# Patient Record
Sex: Male | Born: 1950 | Race: White | Hispanic: No | Smoking: Never smoker
Health system: Southern US, Community
[De-identification: ages and names within clinical notes are randomized; demographics above are authoritative.]

## PROBLEM LIST (undated history)

## (undated) DIAGNOSIS — I10 Essential (primary) hypertension: Secondary | ICD-10-CM

## (undated) DIAGNOSIS — F32A Depression, unspecified: Secondary | ICD-10-CM

## (undated) DIAGNOSIS — F329 Major depressive disorder, single episode, unspecified: Secondary | ICD-10-CM

## (undated) DIAGNOSIS — I2510 Atherosclerotic heart disease of native coronary artery without angina pectoris: Secondary | ICD-10-CM

## (undated) DIAGNOSIS — F419 Anxiety disorder, unspecified: Secondary | ICD-10-CM

## (undated) DIAGNOSIS — I341 Nonrheumatic mitral (valve) prolapse: Secondary | ICD-10-CM

## (undated) HISTORY — DX: Major depressive disorder, single episode, unspecified: F32.9

## (undated) HISTORY — DX: Depression, unspecified: F32.A

## (undated) HISTORY — DX: Anxiety disorder, unspecified: F41.9

---

## 2012-10-21 ENCOUNTER — Ambulatory Visit (INDEPENDENT_AMBULATORY_CARE_PROVIDER_SITE_OTHER): Payer: 59 | Admitting: Surgery

## 2012-10-21 ENCOUNTER — Encounter (INDEPENDENT_AMBULATORY_CARE_PROVIDER_SITE_OTHER): Payer: Self-pay | Admitting: Surgery

## 2012-10-21 ENCOUNTER — Encounter (HOSPITAL_BASED_OUTPATIENT_CLINIC_OR_DEPARTMENT_OTHER): Payer: Self-pay | Admitting: *Deleted

## 2012-10-21 VITALS — BP 126/88 | HR 83 | Temp 97.6°F | Ht 66.0 in | Wt 169.4 lb

## 2012-10-21 DIAGNOSIS — K4090 Unilateral inguinal hernia, without obstruction or gangrene, not specified as recurrent: Secondary | ICD-10-CM

## 2012-10-21 NOTE — Patient Instructions (Signed)
[facility]  HERNIA REPAIR POST OP INSTRUCTIONS  Always review your discharge instruction sheet given to you by the facility where your surgery was performed.  1. A  prescription for pain medication may be given to you upon discharge.  Take your pain medication as prescribed.  If narcotic pain medicine is not needed, then you may take acetaminophen (Tylenol) or ibuprofen (Advil) as needed.  2. Take your usually prescribed medications unless otherwise directed.  3. If you need a refill on your pain medication, please contact your pharmacy.  They will contact our office to request authorization. Prescriptions will not be filled after 5 pm daily or on weekends.  4. You should follow a light diet the first 24 hours after arrival home, such as soup and crackers or toast.  Be sure to include plenty of fluids daily.  Resume your normal diet the day after surgery.  5. Most patients will experience some swelling and bruising around the surgical site.  Ice packs and reclining will help.  Swelling and bruising can take several days to resolve.   6. It is common to experience some constipation if taking pain medication after surgery.  Increasing fluid intake and taking a stool softener (such as Colace) will usually help or prevent this problem from occurring.  A mild laxative (Milk of Magnesia or Miralax) should be taken according to package directions if there are no bowel movements after 48 hours.  7. Unless discharge instructions indicate otherwise, you may remove your bandages 24-48 hours after surgery, and you may shower at that time.  You may have steri-strips (small skin tapes) in place directly over the incision.  These strips should be left on the skin for 7-10 days.  If your surgeon used skin glue on the incision, you may shower in 24 hours.  The glue will flake off over the next 2-3 weeks.  Any sutures or staples will be removed at the office during your follow-up  visit.  8. ACTIVITIES:  You may resume regular (light) daily activities beginning the next day-such as daily self-care, walking, climbing stairs-gradually increasing activities as tolerated.  You may have sexual intercourse when it is comfortable.  Refrain from any heavy lifting or straining until approved by your doctor.  You may drive when you are no longer taking prescription pain medication, you can comfortably wear a seatbelt, and you can safely maneuver your car and apply brakes.  9. You should see your doctor in the office for a follow-up appointment approximately 2-3 weeks after your surgery.  Make sure that you call for this appointment within a day or two after you arrive home to insure a convenient appointment time. 10.   WHEN TO CALL YOUR DOCTOR: 1. Fever greater than 101.0 2. Inability to urinate 3. Persistent nausea and/or vomiting 4. Extreme swelling or bruising 5. Continued bleeding from incision 6. Increased pain, redness, or drainage from the incision  The clinic staff is available to answer your questions during regular business hours.  Please don't hesitate to call and ask to speak to one of the nurses for clinical concerns.  If you have a medical emergency, go to the nearest emergency room or call 911.  A surgeon from [facility] is always on call for the hospital.   [facility] [address], [address], [place],   [address]  [phone] ? [phone] ? FAX [phone]  [email or web address]   

## 2012-10-21 NOTE — Progress Notes (Signed)
General Surgery [facility]  Chief Complaint  Patient presents with  . New Evaluation    eval [facility] - referral from Dr. Sandi Mariscal    HISTORY: Patient is a 62 year old white male who presents with newly diagnosed right inguinal hernia. Patient had initially done some lifting in the fall while moving heavy boxes. He experienced some discomfort in the right groin. This has become progressively more noticeable. He is noted a bulge in the right groin. He has intermittent discomfort related to physical activity. Patient resented to his primary care physician and was diagnosed with right inguinal hernia. He now presents for surgical assessment and repair of right inguinal hernia.  Patient has no prior history of abdominal surgery. He has undergone no prior hernia repairs.  Past Medical History  Diagnosis Date  . Anxiety   . Depression      Current Outpatient Prescriptions  Medication Sig Dispense Refill  . ALPRAZolam (XANAX) 0.5 MG tablet       . docusate sodium (COLACE) 100 MG capsule Take 100 mg by mouth 2 (two) times daily.      . phenelzine (NARDIL) 15 MG tablet        No current facility-administered medications for this visit.     Allergies  Allergen Reactions  . Aspirin     eyes     Family History  Problem Relation Age of Onset  . Diabetes Mother   . Congestive Heart Failure Mother   . Stroke Mother   . Aneurysm Father      History   Social History  . Marital Status: Unknown    Spouse Name: N/A    Number of Children: N/A  . Years of Education: N/A   Social History Main Topics  . Smoking status: Never Smoker   . Smokeless tobacco: None  . Alcohol Use: No  . Drug Use: No  . Sexually Active: None   Other Topics Concern  . None   Social History Narrative  . None     REVIEW OF SYSTEMS - PERTINENT POSITIVES ONLY: Intermittent discomfort. No signs or symptoms of intestinal obstruction.  EXAM: Filed Vitals:   10/21/12 1155   BP: 126/88  Pulse: 83  Temp: 97.6 F (36.4 C)    HEENT: normocephalic; pupils equal and reactive; sclerae clear; dentition good; mucous membranes moist NECK:  symmetric on extension; no palpable anterior or posterior cervical lymphadenopathy; no supraclavicular masses; no tenderness CHEST: clear to auscultation bilaterally without rales, rhonchi, or wheezes CARDIAC: regular rate and rhythm without significant murmur; peripheral pulses are full ABDOMEN: soft without distension; bowel sounds present; no mass; no hepatosplenomegaly GU:  Normal male without mass or lesion. Palpation in the left inguinal canal shows no sign of hernia with cough and Valsalva. Palpation in the right inguinal canal shows an obvious bulge. This is reducible with some discomfort. Examination causes moderate discomfort. EXT:  non-tender without edema; no deformity NEURO: no gross focal deficits; no sign of tremor   LABORATORY RESULTS: See [facility] (CHL-Epic) for most recent results   RADIOLOGY RESULTS: See [facility] (CHL-Epic) for most recent results   IMPRESSION: Right inguinal hernia, reducible, symptomatic  PLAN: I discussed the above findings with the patient and his friend in the office today. I provided him with written literature to review. I have recommended right inguinal hernia repair with mesh. We discussed the risk and benefits of the procedure including the risk of recurrence. We discussed restrictions on his activities after the  procedure. He understands and wishes to proceed. We will make arrangements for outpatient surgery in the near future.  The risks and benefits of the procedure have been discussed at length with the patient.  The patient understands the proposed procedure, potential alternative treatments, and the course of recovery to be expected.  All of the patient's questions have been answered at this time.  The patient wishes to proceed with surgery.  Velora Heckler, MD,  FACS General & Endocrine Surgery [facility]   Visit Diagnoses: 1. Inguinal hernia unilateral, non-recurrent, right     Primary Care Physician: No primary provider on file.

## 2012-10-21 NOTE — Progress Notes (Signed)
Called [facility] for pt med records.

## 2012-10-22 ENCOUNTER — Encounter (HOSPITAL_BASED_OUTPATIENT_CLINIC_OR_DEPARTMENT_OTHER)
Admission: RE | Disposition: A | Discharge status: Home / Self Care | Payer: Self-pay | Source: Ambulatory Visit | Attending: Surgery

## 2012-10-22 ENCOUNTER — Ambulatory Visit (HOSPITAL_BASED_OUTPATIENT_CLINIC_OR_DEPARTMENT_OTHER): Payer: 59 | Admitting: Anesthesiology

## 2012-10-22 ENCOUNTER — Ambulatory Visit (HOSPITAL_BASED_OUTPATIENT_CLINIC_OR_DEPARTMENT_OTHER)
Admission: RE | Admit: 2012-10-22 | Discharge: 2012-10-22 | Disposition: A | Discharge status: Home / Self Care | Payer: 59 | Source: Ambulatory Visit | Attending: Surgery | Admitting: Surgery

## 2012-10-22 ENCOUNTER — Encounter (HOSPITAL_BASED_OUTPATIENT_CLINIC_OR_DEPARTMENT_OTHER): Payer: Self-pay | Admitting: Anesthesiology

## 2012-10-22 DIAGNOSIS — F411 Generalized anxiety disorder: Secondary | ICD-10-CM | POA: Insufficient documentation

## 2012-10-22 DIAGNOSIS — Z886 Allergy status to analgesic agent status: Secondary | ICD-10-CM | POA: Insufficient documentation

## 2012-10-22 DIAGNOSIS — K4090 Unilateral inguinal hernia, without obstruction or gangrene, not specified as recurrent: Secondary | ICD-10-CM

## 2012-10-22 DIAGNOSIS — F3289 Other specified depressive episodes: Secondary | ICD-10-CM | POA: Insufficient documentation

## 2012-10-22 DIAGNOSIS — F329 Major depressive disorder, single episode, unspecified: Secondary | ICD-10-CM | POA: Insufficient documentation

## 2012-10-22 DIAGNOSIS — Z79899 Other long term (current) drug therapy: Secondary | ICD-10-CM | POA: Insufficient documentation

## 2012-10-22 HISTORY — DX: Nonrheumatic mitral (valve) prolapse: I34.1

## 2012-10-22 HISTORY — DX: Atherosclerotic heart disease of native coronary artery without angina pectoris: I25.10

## 2012-10-22 HISTORY — PX: INGUINAL HERNIA REPAIR: SHX194

## 2012-10-22 HISTORY — PX: HERNIA REPAIR: SHX51

## 2012-10-22 HISTORY — PX: INSERTION OF MESH: SHX5868

## 2012-10-22 SURGERY — REPAIR, HERNIA, INGUINAL, ADULT
Anesthesia: General | Site: Abdomen | Laterality: Right | Wound class: Clean

## 2012-10-22 MED ORDER — ONDANSETRON HCL 4 MG/2ML IJ SOLN: 4.0000 mg | Freq: Once | INTRAMUSCULAR | Status: DC | PRN

## 2012-10-22 MED ORDER — FENTANYL CITRATE 0.05 MG/ML IJ SOLN
INTRAMUSCULAR | Status: DC | PRN
  Administered 2012-10-22 (×3): 50 ug via INTRAVENOUS

## 2012-10-22 MED ORDER — ACETAMINOPHEN 10 MG/ML IV SOLN: 1000.0000 mg | Freq: Once | INTRAVENOUS | Status: DC | PRN

## 2012-10-22 MED ORDER — MIDAZOLAM HCL 5 MG/5ML IJ SOLN
INTRAMUSCULAR | Status: DC | PRN
  Administered 2012-10-22 (×2): 1 mg via INTRAVENOUS

## 2012-10-22 MED ORDER — FENTANYL CITRATE 0.05 MG/ML IJ SOLN
25.0000 ug | INTRAMUSCULAR | Status: DC | PRN
Start: 2012-10-22 — End: 2012-10-22
  Administered 2012-10-22: 25 ug via INTRAVENOUS
  Administered 2012-10-22: 50 ug via INTRAVENOUS

## 2012-10-22 MED ORDER — ONDANSETRON HCL 4 MG/2ML IJ SOLN
INTRAMUSCULAR | Status: DC | PRN
  Administered 2012-10-22: 4 mg via INTRAVENOUS

## 2012-10-22 MED ORDER — CEFAZOLIN SODIUM-DEXTROSE 2-3 GM-% IV SOLR
2.0000 g | INTRAVENOUS | Status: AC
  Administered 2012-10-22: 2 g via INTRAVENOUS

## 2012-10-22 MED ORDER — BUPIVACAINE HCL (PF) 0.5 % IJ SOLN
INTRAMUSCULAR | Status: DC | PRN
  Administered 2012-10-22: 20 mL

## 2012-10-22 MED ORDER — LACTATED RINGERS IV SOLN
INTRAVENOUS | Status: DC
  Administered 2012-10-22: 20 mL/h via INTRAVENOUS
  Administered 2012-10-22 (×2): via INTRAVENOUS

## 2012-10-22 MED ORDER — HYDROCODONE-ACETAMINOPHEN 5-325 MG PO TABS
1.0000 | ORAL_TABLET | Freq: Four times a day (QID) | ORAL | Status: AC | PRN
  Administered 2012-10-22: 1 via ORAL

## 2012-10-22 MED ORDER — PHENYLEPHRINE HCL 10 MG/ML IJ SOLN
INTRAMUSCULAR | Status: DC | PRN
  Administered 2012-10-22 (×4): 40 ug via INTRAVENOUS

## 2012-10-22 MED ORDER — LIDOCAINE HCL (CARDIAC) 20 MG/ML IV SOLN
INTRAVENOUS | Status: DC | PRN
  Administered 2012-10-22: 40 mg via INTRAVENOUS

## 2012-10-22 MED ORDER — PROPOFOL 10 MG/ML IV BOLUS
INTRAVENOUS | Status: DC | PRN
  Administered 2012-10-22: 20 mg via INTRAVENOUS
  Administered 2012-10-22: 30 mg via INTRAVENOUS
  Administered 2012-10-22: 180 mg via INTRAVENOUS

## 2012-10-22 SURGICAL SUPPLY — 42 items
BENZOIN TINCTURE PRP APPL 2/3 (GAUZE/BANDAGES/DRESSINGS) ×3 IMPLANT
BLADE SURG 15 STRL LF DISP TIS (BLADE) ×2 IMPLANT
BLADE SURG 15 STRL SS (BLADE) ×1
BLADE SURG ROTATE 9660 (MISCELLANEOUS) ×3 IMPLANT
CANISTER SUCTION 1200CC (MISCELLANEOUS) IMPLANT
CHLORAPREP W/TINT 26ML (MISCELLANEOUS) ×3 IMPLANT
CLEANER CAUTERY TIP 5X5 PAD (MISCELLANEOUS) ×2 IMPLANT
CLOTH BEACON ORANGE TIMEOUT ST (SAFETY) ×3 IMPLANT
COVER MAYO STAND STRL (DRAPES) ×3 IMPLANT
COVER TABLE BACK 60X90 (DRAPES) ×3 IMPLANT
DECANTER SPIKE VIAL GLASS SM (MISCELLANEOUS) IMPLANT
DRAIN PENROSE 1/2X12 LTX STRL (WOUND CARE) ×3 IMPLANT
DRAPE PED LAPAROTOMY (DRAPES) ×3 IMPLANT
DRAPE UTILITY XL STRL (DRAPES) ×3 IMPLANT
ELECT REM PT RETURN 9FT ADLT (ELECTROSURGICAL) ×3
ELECTRODE REM PT RTRN 9FT ADLT (ELECTROSURGICAL) ×2 IMPLANT
GAUZE SPONGE 4X4 12PLY STRL LF (GAUZE/BANDAGES/DRESSINGS) IMPLANT
GLOVE BIO SURGEON STRL SZ7.5 (GLOVE) ×3 IMPLANT
GLOVE BIOGEL PI IND STRL 8 (GLOVE) ×2 IMPLANT
GLOVE BIOGEL PI INDICATOR 8 (GLOVE) ×1
GLOVE SURG ORTHO 8.0 STRL STRW (GLOVE) ×6 IMPLANT
GOWN PREVENTION PLUS XLARGE (GOWN DISPOSABLE) ×3 IMPLANT
GOWN PREVENTION PLUS XXLARGE (GOWN DISPOSABLE) ×6 IMPLANT
MESH ULTRAPRO 3X6 7.6X15CM (Mesh General) ×3 IMPLANT
NEEDLE HYPO 25X1 1.5 SAFETY (NEEDLE) ×3 IMPLANT
NS IRRIG 1000ML POUR BTL (IV SOLUTION) ×3 IMPLANT
PACK BASIN DAY SURGERY FS (CUSTOM PROCEDURE TRAY) ×3 IMPLANT
PAD CLEANER CAUTERY TIP 5X5 (MISCELLANEOUS) ×1
PENCIL BUTTON HOLSTER BLD 10FT (ELECTRODE) ×3 IMPLANT
SLEEVE SCD COMPRESS KNEE MED (MISCELLANEOUS) ×3 IMPLANT
STRIP CLOSURE SKIN 1/2X4 (GAUZE/BANDAGES/DRESSINGS) ×3 IMPLANT
SUT MNCRL AB 4-0 PS2 18 (SUTURE) ×3 IMPLANT
SUT NOVA NAB GS-22 2 0 T19 (SUTURE) ×6 IMPLANT
SUT SILK 2 0 SH (SUTURE) ×3 IMPLANT
SUT SILK 2 0 TIES 17X18 (SUTURE)
SUT SILK 2-0 18XBRD TIE BLK (SUTURE) IMPLANT
SUT VICRYL 3-0 CR8 SH (SUTURE) ×3 IMPLANT
SYR CONTROL 10ML LL (SYRINGE) ×3 IMPLANT
TOWEL OR 17X24 6PK STRL BLUE (TOWEL DISPOSABLE) ×6 IMPLANT
TOWEL OR NON WOVEN STRL DISP B (DISPOSABLE) ×3 IMPLANT
TUBE CONNECTING 20X1/4 (TUBING) IMPLANT
YANKAUER SUCT BULB TIP NO VENT (SUCTIONS) IMPLANT

## 2012-10-22 NOTE — Interval H&P Note (Signed)
History and Physical Interval Note:  10/22/2012 2:57 PM  Shane Hendricks  has presented today for surgery, with the diagnosis of right ingunial hernia.  The various methods of treatment have been discussed with the patient and family. After consideration of risks, benefits and other options for treatment, the patient has consented to    Procedure(s): HERNIA REPAIR INGUINAL ADULT (Right) INSERTION OF MESH (N/A) as a surgical intervention .    The patient's history has been reviewed, patient examined, no change in status, stable for surgery.  I have reviewed the patient's chart and labs.  Questions were answered to the patient's satisfaction.    Velora Heckler, MD, [facility] Office: [phone]    Shane Hendricks Judie Petit

## 2012-10-22 NOTE — Anesthesia Postprocedure Evaluation (Signed)
  Anesthesia Post-op Note  Patient: Shane Hendricks  Procedure(s) Performed: Procedure(s): HERNIA REPAIR INGUINAL ADULT (Right) INSERTION OF MESH (N/A)  Patient Location: PACU  Anesthesia Type:General  Level of Consciousness: awake, alert  and oriented  Airway and Oxygen Therapy: Patient Spontanous Breathing and Patient connected to nasal cannula oxygen  Post-op Pain: mild  Post-op Assessment: Post-op Vital signs reviewed, Patient's Cardiovascular Status Stable, Respiratory Function Stable, Patent Airway and Pain level controlled  Post-op Vital Signs: stable  Complications: No apparent anesthesia complications

## 2012-10-22 NOTE — Op Note (Signed)
Inguinal Hernia, Open, Procedure Note  Pre-operative Diagnosis:  Right inguinal hernia  Post-operative Diagnosis: right inguinal hernia, right spermatic cord mass  Surgeon:  Velora Heckler, MD, FACS  Anesthesia:  General  Indications: The patient presented with a right, reducible hernia.    Procedure Details  The patient was seen again in the Holding Room. The risks, benefits, complications, treatment options, and expected outcomes were discussed with the patient.  There was concurrence with the proposed plan, and informed consent was obtained. The site of surgery was properly noted/marked. The patient was taken to the Operating Room, identified by name, and the procedure verified as hernia repair. A Time Out was held and the above information confirmed.  The patient was placed in the supine position and underwent induction of anesthesia.  The lower abdomen and groin was prepped and draped in the usual strict aseptic fashion.  After ascertaining that an adequate level of anesthesia had been obtained, and incision is made in the groin with a #10 blade.  Dissection is carried through the subcutaneous tissues and hemostasis obtained with the electrocautery.  A Gelpi retractor is placed for exposure.  The external oblique fascia is incised in line with it's fibers and extended through the external inguinal ring.  The cord structures are dissected out of the inguinal canal and encircled with a Penrose drain.  The floor of the inguinal canal is dissected out.  The cord is explored explored and there is no evidence of indirect hernia.  The floor of the canal demonstrates a moderate sized direct hernia which is easily reduced.  It is held in reduction with interrupted 2-0 silk sutures.  The floor of the inguinal canal is reconstructed with a sheet of mesh cut to the appropriate dimensions.  It is secured to the pubic tubercle with a 2-0 Novafil suture and along the inguinal ligament with a running 2-0  Novafil suture.  Mesh is split to accommodate the cord structures.  The superior edge of the mesh is secured to the transversalis and internal oblique muscles with interrupted 2-0 Novafil sutures.  The tails of the mesh are overlapped lateral to the cord structures and secured to the inguinal ligament with interrupted 2-0 Novafil sutures to recreate the internal inguinal ring.  Cord structures are returned to the inguinal canal.  Local anesthetic is infiltrated throughout the field.  External oblique fascia is closed with interrupted 3-0 Vicryl sutures.  Subcutaneous tissues are closed with interrupted 3-0 Vicryl sutures.  Skin is anesthetized with local anesthetic, and the skin edges re-approximated with a running 4-0 Monocryl suture.  Wound is washed and dried and benzoin and steristrips are applied.  A gauze dressing is then applied.  Palpation reveals the right testicle to be in the right hemiscrotum.  However, there is a palpable mass measuring about 3 cm in size in the superior part of the scrotum.  This structure is firm but mobile.  It will require urologic evaluation.  Instrument, sponge, and needle counts were correct prior to closure and at the conclusion of the case.  Velora Heckler, MD, FACS General & Endocrine Surgery [facility]   Findings: Hernia as above  Estimated Blood Loss: Minimal         Specimens: None  Complications: None; patient tolerated the procedure well.         Disposition: PACU - hemodynamically stable.         Condition: stable  Velora Heckler, MD, [facility] Office: [phone]

## 2012-10-22 NOTE — Anesthesia Preprocedure Evaluation (Signed)
Anesthesia Evaluation  Patient identified by MRN, date of birth, ID band Patient awake    Reviewed: Allergy & Precautions, H&P , NPO status , Patient's Chart, lab work & pertinent test results  Airway Mallampati: II      Dental  (+) Teeth Intact and Dental Advisory Given   Pulmonary  breath sounds clear to auscultation        Cardiovascular Rhythm:Regular Rate:Normal     Neuro/Psych    GI/Hepatic   Endo/Other    Renal/GU      Musculoskeletal   Abdominal   Peds  Hematology   Anesthesia Other Findings   Reproductive/Obstetrics                           Anesthesia Physical Anesthesia Plan  ASA: III  Anesthesia Plan: General   Post-op Pain Management:    Induction: Intravenous  Airway Management Planned: LMA  Additional Equipment:   Intra-op Plan:   Post-operative Plan:   Informed Consent: I have reviewed the patients History and Physical, chart, labs and discussed the procedure including the risks, benefits and alternatives for the proposed anesthesia with the patient or authorized representative who has indicated his/her understanding and acceptance.   Dental advisory given  Plan Discussed with:   Anesthesia Plan Comments: (R. iguinal hernia H/O depression on Nardil (MAOI)   Plan GA with LMA will avoid demerol and ephedrine)        Anesthesia Quick Evaluation

## 2012-10-22 NOTE — Transfer of Care (Signed)
Immediate Anesthesia Transfer of Care Note  Patient: Shane Hendricks  Procedure(s) Performed: Procedure(s): HERNIA REPAIR INGUINAL ADULT (Right) INSERTION OF MESH (N/A)  Patient Location: PACU  Anesthesia Type:General  Level of Consciousness: sedated  Airway & Oxygen Therapy: Patient Spontanous Breathing and Patient connected to face mask oxygen  Post-op Assessment: Report given to PACU RN and Post -op Vital signs reviewed and stable  Post vital signs: Reviewed and stable  Complications: No apparent anesthesia complications

## 2012-10-22 NOTE — H&P (View-Only) (Signed)
General Surgery - [facility]  Chief Complaint  Patient presents with  . New Evaluation    eval [facility] - referral from Dr. David Rinehart    HISTORY: Patient is a 62-year-old white male who presents with newly diagnosed right inguinal hernia. Patient had initially done some lifting in the fall while moving heavy boxes. He experienced some discomfort in the right groin. This has become progressively more noticeable. He is noted a bulge in the right groin. He has intermittent discomfort related to physical activity. Patient resented to his primary care physician and was diagnosed with right inguinal hernia. He now presents for surgical assessment and repair of right inguinal hernia.  Patient has no prior history of abdominal surgery. He has undergone no prior hernia repairs.  Past Medical History  Diagnosis Date  . Anxiety   . Depression      Current Outpatient Prescriptions  Medication Sig Dispense Refill  . ALPRAZolam (XANAX) 0.5 MG tablet       . docusate sodium (COLACE) 100 MG capsule Take 100 mg by mouth 2 (two) times daily.      . phenelzine (NARDIL) 15 MG tablet        No current facility-administered medications for this visit.     Allergies  Allergen Reactions  . Aspirin     eyes     Family History  Problem Relation Age of Onset  . Diabetes Mother   . Congestive Heart Failure Mother   . Stroke Mother   . Aneurysm Father      History   Social History  . Marital Status: Unknown    Spouse Name: N/A    Number of Children: N/A  . Years of Education: N/A   Social History Main Topics  . Smoking status: Never Smoker   . Smokeless tobacco: None  . Alcohol Use: No  . Drug Use: No  . Sexually Active: None   Other Topics Concern  . None   Social History Narrative  . None     REVIEW OF SYSTEMS - PERTINENT POSITIVES ONLY: Intermittent discomfort. No signs or symptoms of intestinal obstruction.  EXAM: Filed Vitals:   10/21/12 1155   BP: 126/88  Pulse: 83  Temp: 97.6 F (36.4 C)    HEENT: normocephalic; pupils equal and reactive; sclerae clear; dentition good; mucous membranes moist NECK:  symmetric on extension; no palpable anterior or posterior cervical lymphadenopathy; no supraclavicular masses; no tenderness CHEST: clear to auscultation bilaterally without rales, rhonchi, or wheezes CARDIAC: regular rate and rhythm without significant murmur; peripheral pulses are full ABDOMEN: soft without distension; bowel sounds present; no mass; no hepatosplenomegaly GU:  Normal male without mass or lesion. Palpation in the left inguinal canal shows no sign of hernia with cough and Valsalva. Palpation in the right inguinal canal shows an obvious bulge. This is reducible with some discomfort. Examination causes moderate discomfort. EXT:  non-tender without edema; no deformity NEURO: no gross focal deficits; no sign of tremor   LABORATORY RESULTS: See [facility] (CHL-Epic) for most recent results   RADIOLOGY RESULTS: See [facility] (CHL-Epic) for most recent results   IMPRESSION: Right inguinal hernia, reducible, symptomatic  PLAN: I discussed the above findings with the patient and his friend in the office today. I provided him with written literature to review. I have recommended right inguinal hernia repair with mesh. We discussed the risk and benefits of the procedure including the risk of recurrence. We discussed restrictions on his activities after the   procedure. He understands and wishes to proceed. We will make arrangements for outpatient surgery in the near future.  The risks and benefits of the procedure have been discussed at length with the patient.  The patient understands the proposed procedure, potential alternative treatments, and the course of recovery to be expected.  All of the patient's questions have been answered at this time.  The patient wishes to proceed with surgery.  Carrie Schoonmaker M. Hazelee Harbold, MD,  FACS General & Endocrine Surgery [facility]   Visit Diagnoses: 1. Inguinal hernia unilateral, non-recurrent, right     Primary Care Physician: No primary provider on file.   

## 2012-10-22 NOTE — Anesthesia Procedure Notes (Signed)
Procedure Name: LMA Insertion Date/Time: 10/22/2012 3:06 PM Performed by: Burna Cash Pre-anesthesia Checklist: Patient identified, Emergency Drugs available, Suction available and Patient being monitored Patient Re-evaluated:Patient Re-evaluated prior to inductionOxygen Delivery Method: Circle System Utilized Preoxygenation: Pre-oxygenation with 100% oxygen Intubation Type: IV induction Ventilation: Mask ventilation without difficulty LMA: LMA inserted LMA Size: 5.0 Number of attempts: 1 Airway Equipment and Method: bite block Placement Confirmation: positive ETCO2 Tube secured with: Tape Dental Injury: Teeth and Oropharynx as per pre-operative assessment

## 2012-10-23 ENCOUNTER — Telehealth (INDEPENDENT_AMBULATORY_CARE_PROVIDER_SITE_OTHER): Payer: Self-pay | Admitting: *Deleted

## 2012-10-23 ENCOUNTER — Encounter (HOSPITAL_BASED_OUTPATIENT_CLINIC_OR_DEPARTMENT_OTHER): Payer: Self-pay | Admitting: Surgery

## 2012-10-23 ENCOUNTER — Telehealth (INDEPENDENT_AMBULATORY_CARE_PROVIDER_SITE_OTHER): Payer: Self-pay

## 2012-10-23 NOTE — Telephone Encounter (Signed)
Pts caregiver Alinda Money called state he himself is having medical issues, anxiety and is having difficulty dealing with pts needs. I spoke with pt and he states he is also having severe anxiety issues and is treated by Dr Sheilah Mins local to his home. Pt advised he should call Dr Sheilah Mins and review his anxiety concerns or go to local ER if he feels his anxiety is overwhelming. Pt states his wound is fine. Pt also advised to limit narcotic meds if he is noticing this increases his anxiety. Pt states he understands and will call Dr Sheilah Mins now.

## 2012-10-23 NOTE — Telephone Encounter (Signed)
Telephone call from [employer].  Pt c/o dizziness with pain meds.  Patient states he has not been eating and has been taking hydrocodone regularly.  Patient became dizzy upon standing.  Advised patient to eat and take 1/2 of the hydrocodone pill if he desires.  Patient agrees with plan.  Will call back if dizziness continues after eating.

## 2012-10-26 ENCOUNTER — Telehealth (INDEPENDENT_AMBULATORY_CARE_PROVIDER_SITE_OTHER): Payer: Self-pay

## 2012-10-26 NOTE — Telephone Encounter (Signed)
Pt called stating Dr Gerrit Friends mentioned a scrotal mass that will need urology eval in future. Pt wants to know when this referral be made. I advised pt I will send request to Dr Gerrit Friends to see if ref should be made now or after po appt and what urologist in [place] he recommends. Pt advised he or I will call pt back with recommendation.

## 2012-10-28 ENCOUNTER — Telehealth (INDEPENDENT_AMBULATORY_CARE_PROVIDER_SITE_OTHER): Payer: Self-pay

## 2012-10-28 ENCOUNTER — Other Ambulatory Visit (INDEPENDENT_AMBULATORY_CARE_PROVIDER_SITE_OTHER): Payer: Self-pay

## 2012-10-28 ENCOUNTER — Telehealth (INDEPENDENT_AMBULATORY_CARE_PROVIDER_SITE_OTHER): Payer: Self-pay | Admitting: General Surgery

## 2012-10-28 DIAGNOSIS — N5089 Other specified disorders of the male genital organs: Secondary | ICD-10-CM

## 2012-10-28 NOTE — Telephone Encounter (Signed)
Message copied by Joanette Gula on Wed Oct 28, 2012 11:37 AM ------      Message from: Gabriel Rainwater      Created: Wed Oct 28, 2012 10:59 AM       Arline Asp he above has appt tomorrow  With Dr Annabell Howells he is aware  ------

## 2012-10-28 NOTE — Telephone Encounter (Signed)
Patient has is aware of appt  With Dr Annabell Howells 10/29/12 12:15

## 2012-10-28 NOTE — Telephone Encounter (Signed)
Order for urology ref to Dr Annabell Howells sent ot Susie to set up and call pt.

## 2012-11-09 ENCOUNTER — Encounter (INDEPENDENT_AMBULATORY_CARE_PROVIDER_SITE_OTHER): Payer: Self-pay | Admitting: Surgery

## 2012-11-09 ENCOUNTER — Ambulatory Visit (INDEPENDENT_AMBULATORY_CARE_PROVIDER_SITE_OTHER): Payer: 59 | Admitting: Surgery

## 2012-11-09 VITALS — BP 144/90 | HR 75 | Temp 97.6°F | Ht 66.0 in | Wt 169.0 lb

## 2012-11-09 DIAGNOSIS — K4090 Unilateral inguinal hernia, without obstruction or gangrene, not specified as recurrent: Secondary | ICD-10-CM

## 2012-11-09 NOTE — Progress Notes (Signed)
General Surgery [facility]  Visit Diagnoses: 1. Inguinal hernia unilateral, non-recurrent, right     HISTORY: Patient is a 62 year old white male who underwent repair of right inguinal hernia with mesh. Postoperative course has been largely uneventful. Patient was noted at the time of surgery to have a mass in the right spermatic cord. He was seen in consultation by Dr. Bjorn Pippin at [facility]. Workup included ultrasound. This appears to be a benign process. Patient is scheduled to return for evaluation in July 2014.  EXAM: Surgical incision is well-healed. There is some maceration of the skin edges due to moisture. Steri-Strips are removed in the office. No sign of infection. Palpation in the inguinal canal with cough and Valsalva shows no sign of recurrence.  IMPRESSION: #1 Status post right inguinal hernia repair with mesh #2 right spermatic cord cyst  PLAN: Patient is reassured. He will begin applying topical creams to his incision. He has been having problems with constipation and I had given him a prescription for MiraLAX powder to take twice daily. He will return for a final wound check in 4-6 weeks.  Velora Heckler, MD, FACS General & Endocrine Surgery [facility]

## 2012-11-09 NOTE — Patient Instructions (Signed)
  COCOA BUTTER & VITAMIN E CREAM  ([employer] or other brand)  Apply cocoa butter/vitamin E cream to your incision 2 - 3 times daily.  Massage cream into incision for one minute with each application.  Use sunscreen (50 SPF or higher) for first 6 months after surgery if area is exposed to sun.  You may substitute Mederma or other scar reducing creams as desired.   

## 2012-12-28 ENCOUNTER — Ambulatory Visit (INDEPENDENT_AMBULATORY_CARE_PROVIDER_SITE_OTHER): Payer: 59 | Admitting: Surgery

## 2012-12-28 ENCOUNTER — Encounter (INDEPENDENT_AMBULATORY_CARE_PROVIDER_SITE_OTHER): Payer: Self-pay | Admitting: Surgery

## 2012-12-28 VITALS — BP 114/70 | HR 119 | Temp 99.2°F | Resp 17 | Ht 66.0 in | Wt 172.8 lb

## 2012-12-28 DIAGNOSIS — K4090 Unilateral inguinal hernia, without obstruction or gangrene, not specified as recurrent: Secondary | ICD-10-CM

## 2012-12-28 NOTE — Progress Notes (Signed)
General Surgery [facility]  Visit Diagnoses: 1. Inguinal hernia unilateral, non-recurrent, right     HISTORY: Patient returns for final wound check having undergone right inguinal hernia repair with mesh. Postoperative course has been largely uncomplicated. A spermatic cord mass identified at the time of surgery he is currently being evaluated by urology. This appears to be a benign process.  EXAM: Surgical incision is healed nicely. Patient is applying topical creams to the incision. There is no sign of infection. Palpation in the inguinal canal with cough and Valsalva shows no sign of recurrence. Spermatocele is palpable and is mildly tender.  IMPRESSION: Status post right inguinal hernia repair with mesh  PLAN: Patient is released to full activity without restriction.  Patient is scheduled to followup with urology in July 2014.  Patient will return for surgical care as needed.  Velora Heckler, MD, FACS General & Endocrine Surgery [facility]

## 2012-12-28 NOTE — Patient Instructions (Signed)
  COCOA BUTTER & VITAMIN E CREAM  ([employer] or other brand)  Apply cocoa butter/vitamin E cream to your incision 2 - 3 times daily.  Massage cream into incision for one minute with each application.  Use sunscreen (50 SPF or higher) for first 6 months after surgery if area is exposed to sun.  You may substitute Mederma or other scar reducing creams as desired.   

## 2020-07-09 ENCOUNTER — Other Ambulatory Visit: Payer: Self-pay

## 2020-07-09 ENCOUNTER — Emergency Department (HOSPITAL_COMMUNITY): Payer: Medicare PPO

## 2020-07-09 ENCOUNTER — Encounter (HOSPITAL_COMMUNITY): Payer: Self-pay

## 2020-07-09 ENCOUNTER — Emergency Department (HOSPITAL_COMMUNITY)
Admission: EM | Admit: 2020-07-09 | Discharge: 2020-07-09 | Disposition: A | Discharge status: Home / Self Care | Payer: Medicare PPO | Attending: Emergency Medicine | Admitting: Emergency Medicine

## 2020-07-09 DIAGNOSIS — R109 Unspecified abdominal pain: Secondary | ICD-10-CM | POA: Diagnosis present

## 2020-07-09 DIAGNOSIS — R1084 Generalized abdominal pain: Secondary | ICD-10-CM | POA: Diagnosis not present

## 2020-07-09 DIAGNOSIS — I2510 Atherosclerotic heart disease of native coronary artery without angina pectoris: Secondary | ICD-10-CM | POA: Insufficient documentation

## 2020-07-09 LAB — COMPREHENSIVE METABOLIC PANEL
ALT: 30 U/L (ref 0–44)
AST: 48 U/L — ABNORMAL HIGH (ref 15–41)
Albumin: 3.5 g/dL (ref 3.5–5.0)
Alkaline Phosphatase: 61 U/L (ref 38–126)
Anion gap: 7 (ref 5–15)
BUN: 16 mg/dL (ref 8–23)
CO2: 25 mmol/L (ref 22–32)
Calcium: 8.8 mg/dL — ABNORMAL LOW (ref 8.9–10.3)
Chloride: 107 mmol/L (ref 98–111)
Creatinine, Ser: 0.98 mg/dL (ref 0.61–1.24)
GFR, Estimated: >60 mL/min (ref >60)
Glucose, Bld: 153 mg/dL — ABNORMAL HIGH (ref 70–99)
Potassium: 4 mmol/L (ref 3.5–5.1)
Sodium: 139 mmol/L (ref 135–145)
Total Bilirubin: 1.5 mg/dL — ABNORMAL HIGH (ref 0.3–1.2)
Total Protein: 6.9 g/dL (ref 6.5–8.1)

## 2020-07-09 LAB — URINALYSIS, ROUTINE W REFLEX MICROSCOPIC
Bilirubin Urine: NEGATIVE
Glucose, UA: NEGATIVE mg/dL
Hgb urine dipstick: NEGATIVE
Ketones, ur: NEGATIVE mg/dL
Leukocytes,Ua: NEGATIVE
Nitrite: NEGATIVE
Protein, ur: NEGATIVE mg/dL
Specific Gravity, Urine: 1.016 (ref 1.005–1.030)
pH: 8 (ref 5.0–8.0)

## 2020-07-09 LAB — CBC WITH DIFFERENTIAL/PLATELET
Abs Immature Granulocytes: 0.01 10*3/uL (ref 0.00–0.07)
Basophils Absolute: 0.1 10*3/uL (ref 0.0–0.1)
Basophils Relative: 1 %
Eosinophils Absolute: 0.4 10*3/uL (ref 0.0–0.5)
Eosinophils Relative: 7 %
HCT: 39.6 % (ref 39.0–52.0)
Hemoglobin: 13.5 g/dL (ref 13.0–17.0)
Immature Granulocytes: 0 %
Lymphocytes Relative: 25 %
Lymphs Abs: 1.5 10*3/uL (ref 0.7–4.0)
MCH: 30.6 pg (ref 26.0–34.0)
MCHC: 34.1 g/dL (ref 30.0–36.0)
MCV: 89.8 fL (ref 80.0–100.0)
Monocytes Absolute: 0.7 10*3/uL (ref 0.1–1.0)
Monocytes Relative: 12 %
Neutro Abs: 3.4 10*3/uL (ref 1.7–7.7)
Neutrophils Relative %: 55 %
Platelets: 168 10*3/uL (ref 150–400)
RBC: 4.41 MIL/uL (ref 4.22–5.81)
RDW: 12.1 % (ref 11.5–15.5)
WBC: 6.1 10*3/uL (ref 4.0–10.5)
nRBC: 0 % (ref 0.0–0.2)

## 2020-07-09 LAB — LIPASE, BLOOD: Lipase: 38 U/L (ref 11–51)

## 2020-07-09 MED ORDER — DICYCLOMINE HCL 10 MG PO CAPS
10.0000 mg | ORAL_CAPSULE | Freq: Once | ORAL | Status: AC
  Administered 2020-07-09: 10 mg via ORAL
  Filled 2020-07-09: qty 1

## 2020-07-09 MED ORDER — SODIUM CHLORIDE 0.9 % IV BOLUS
1000.0000 mL | Freq: Once | INTRAVENOUS | Status: AC
  Administered 2020-07-09: 1000 mL via INTRAVENOUS

## 2020-07-09 MED ORDER — IOHEXOL 300 MG/ML  SOLN
100.0000 mL | Freq: Once | INTRAMUSCULAR | Status: AC | PRN
  Administered 2020-07-09: 100 mL via INTRAVENOUS

## 2020-07-09 NOTE — ED Provider Notes (Signed)
[facility] DEPT Provider Note   CSN: [id number] Arrival date & time: 07/09/20  1317     History Chief Complaint  Patient presents with   Abdominal Pain    TREW SUNDE is a 69 y.o. male.  Patient is a 69 year old male with a history of depression, mitral valve prolapse and coronary artery disease who presents with abdominal pain.  He reports that about 12:00 today he had a sudden onset of pain across the middle of his abdomen.  Its around his umbilicus.  He said it shot through to his back but now it is mostly around his umbilicus.  It radiates all throughout the abdomen.  He denies any nausea or vomiting.  No change in bowel habits.  No urinary symptoms.  No fevers.  He has had a prior inguinal hernia repair but no other abdominal surgeries.  No chest pain or shortness of breath.  He did eat a large breakfast which included country ham, grits and eggs.  He took some Pepto-Bismol which helped a little bit.        Past Medical History:  Diagnosis Date   Anxiety    Coronary artery disease    1986 only take antibiotics with teeth procedures   Depression    Mitral valve prolapse     Patient Active Problem List   Diagnosis Date Noted   Inguinal hernia unilateral, non-recurrent, right 10/21/2012    Past Surgical History:  Procedure Laterality Date   HERNIA REPAIR Right 10/22/12   RIH   INGUINAL HERNIA REPAIR Right 10/22/2012   Procedure: HERNIA REPAIR INGUINAL ADULT;  Surgeon: Earnstine Regal, MD;  Location: [place];  Service: General;  Laterality: Right;   INSERTION OF MESH N/A 10/22/2012   Procedure: INSERTION OF MESH;  Surgeon: Earnstine Regal, MD;  Location: [facility];  Service: General;  Laterality: N/A;       Family History  Problem Relation Age of Onset   Diabetes Mother    Congestive Heart Failure Mother    Stroke Mother    Aneurysm Father     Social History   Tobacco Use   Smoking  status: Never Smoker  Substance Use Topics   Alcohol use: No   Drug use: No    Home Medications Prior to Admission medications   Medication Sig Start Date End Date Taking? Authorizing Provider  ALPRAZolam Duanne Moron) 0.5 MG tablet  09/14/12   [provider]  docusate sodium (COLACE) 100 MG capsule Take 100 mg by mouth 2 (two) times daily.    [provider]  ibuprofen (ADVIL,MOTRIN) 200 MG tablet Take 200 mg by mouth every 6 (six) hours as needed for pain.    [provider]  phenelzine (NARDIL) 15 MG tablet  10/14/12   [provider]  polyethylene glycol powder (GLYCOLAX/MIRALAX) powder  11/09/12   [provider]    Allergies    Aspirin  Review of Systems   Review of Systems  Constitutional: Negative for chills, diaphoresis, fatigue and fever.  HENT: Negative for congestion, rhinorrhea and sneezing.   Eyes: Negative.   Respiratory: Negative for cough, chest tightness and shortness of breath.   Cardiovascular: Negative for chest pain and leg swelling.  Gastrointestinal: Positive for abdominal pain. Negative for blood in stool, diarrhea, nausea and vomiting.  Genitourinary: Negative for difficulty urinating, flank pain, frequency and hematuria.  Musculoskeletal: Negative for arthralgias and back pain.  Skin: Negative for rash.  Neurological: Negative  for dizziness, speech difficulty, weakness, numbness and headaches.    Physical Exam Updated Vital Signs BP (!) 146/69    Pulse 64    Temp 98 F (36.7 C)    Resp 15    SpO2 98%   Physical Exam Constitutional:      Appearance: He is well-developed and well-nourished.  HENT:     Head: Normocephalic and atraumatic.  Eyes:     Pupils: Pupils are equal, round, and reactive to light.  Cardiovascular:     Rate and Rhythm: Normal rate and regular rhythm.     Heart sounds: Normal heart sounds.  Pulmonary:     Effort: Pulmonary effort is normal. No respiratory distress.     Breath sounds:  Normal breath sounds. No wheezing or rales.  Chest:     Chest wall: No tenderness.  Abdominal:     General: Bowel sounds are normal.     Palpations: Abdomen is soft.     Tenderness: There is generalized abdominal tenderness. There is no guarding or rebound.     Comments: Slightly distended, tympanic to percussion  Musculoskeletal:        General: No edema. Normal range of motion.     Cervical back: Normal range of motion and neck supple.  Lymphadenopathy:     Cervical: No cervical adenopathy.  Skin:    General: Skin is warm and dry.     Findings: No rash.  Neurological:     Mental Status: He is alert and oriented to person, place, and time.  Psychiatric:        Mood and Affect: Mood and affect normal.     ED Results / Procedures / Treatments   Labs (all labs ordered are listed, but only abnormal results are displayed) Labs Reviewed  COMPREHENSIVE METABOLIC PANEL - Abnormal; Notable for the following components:      Result Value   Glucose, Bld 153 (*)    Calcium 8.8 (*)    AST 48 (*)    Total Bilirubin 1.5 (*)    All other components within normal limits  LIPASE, BLOOD  CBC WITH DIFFERENTIAL/PLATELET  URINALYSIS, ROUTINE W REFLEX MICROSCOPIC    EKG None  Radiology CT Abdomen Pelvis W Contrast  Result Date: 07/09/2020 CLINICAL DATA:  Abdominal pain.  History of hernia repair EXAM: CT ABDOMEN AND PELVIS WITH CONTRAST TECHNIQUE: Multidetector CT imaging of the abdomen and pelvis was performed using the standard protocol following bolus administration of intravenous contrast. CONTRAST:  146mL OMNIPAQUE IOHEXOL 300 MG/ML  SOLN COMPARISON:  None. FINDINGS: Lower chest: Lung bases are clear. Hepatobiliary: No focal hepatic lesion. Gallbladder is collapsed. No biliary duct dilatation. Common bile duct is normal. Pancreas: Multi cystic lesion in the tail of the pancreas measuring 24 mm x 11 mm (image 20/2). No clear communication with the pancreatic duct. No comparison available.  No downstream ductal dilatation. The pancreatic head is normal. Spleen: Normal spleen Adrenals/urinary tract: Adrenal glands and kidneys are normal. The ureters and bladder normal. Stomach/Bowel: Stomach, small bowel, appendix, and cecum are normal. The colon and rectosigmoid colon are normal. Vascular/Lymphatic: Abdominal aorta is normal caliber with atherosclerotic calcification. There is no retroperitoneal or periportal lymphadenopathy. No pelvic lymphadenopathy. Reproductive: Prostate unremarkable Other: Ovoid fluid collection within within the RIGHT inguinal canal measuring 4.2 by 3.3 by 2.8 cm in diameter. (Coronal image 53/5 and axial image 85/2). There is no hernia identified. Musculoskeletal: No aggressive osseous lesion. IMPRESSION: 1. Ovoid fluid collection in the RIGHT inguinal canal. Findings  similar to scrotal ultrasound from 2014. 2. No inguinal hernia or ventral hernia identified. 3. Multi cystic lesion in the tail of the pancreas. Favor a pancreatic cysts over duct dilatation. Recommend MRI without and with contrast in 6 months for further characterization. This recommendation follows ACR consensus guidelines: Management of Incidental Pancreatic Cysts: A White Paper of the ACR Incidental Findings Committee. [place] [id number]. Electronically Signed   By: Suzy Bouchard M.D.   On: 07/09/2020 15:54    Procedures Procedures (including critical care time)  Medications Ordered in ED Medications  sodium chloride 0.9 % bolus 1,000 mL (0 mLs Intravenous Stopped 07/09/20 1614)  iohexol (OMNIPAQUE) 300 MG/ML solution 100 mL (100 mLs Intravenous Contrast Given 07/09/20 1522)  dicyclomine (BENTYL) capsule 10 mg (10 mg Oral Given 07/09/20 1644)    ED Course  I have reviewed the triage vital signs and the nursing notes.  Pertinent labs & imaging results that were available during my care of the patient were reviewed by me and considered in my medical decision making (see chart for  details).    MDM Rules/Calculators/A&P                          Patient is a 69 year old male who presents with abdominal pain and some bloating.  This started after eating a large prefaced this morning.  His labs are nonconcerning.  His glucose is mildly elevated.  CT scan of the abdomen pelvis was performed which shows no acute abnormality.  There is a cyst on his pancreas was will need outpatient follow-up.  This was discussed with the patient.  There is a fluid collection in his right inguinal canal.  He has had a prior inguinal hernia repair.  It appears unchanged from prior imaging.  He has no tenderness in this area.  He is feeling better in the ED after a dose of Bentyl.  He is able to eat and drink without significant symptoms.  Has had no nausea or vomiting.  He was discharged home in good condition.  He was advised use a bland diet for the next few days.  He was advised to use Prilosec and/or Tums.  He will follow-up with his primary care doctor if his symptoms continue.  He was advised that he will need to follow-up on his cyst and to recheck his blood glucose by his PCP.  Return precautions were given. Final Clinical Impression(s) / ED Diagnoses Final diagnoses:  Generalized abdominal pain    Rx / DC Orders ED Discharge Orders    None       Malvin Johns, MD 07/09/20 1816

## 2020-07-09 NOTE — Discharge Instructions (Addendum)
Take the Prilosec and Tums as discussed.  Maintain a bland diet for the next few days.  Follow-up with your primary care doctor if the symptoms are continuing.  Return here as needed if you have any worsening pain, vomiting or fevers.  You do have a cyst on your pancreas.  This needs a follow-up MRI within the next 6 months to reevaluate.  This can be ordered by your primary care doctor.   Your glucose level was a little bit on the high side.  This can be rechecked by your primary care doctor.

## 2020-07-09 NOTE — ED Notes (Signed)
Pt provided with Sprite and Saltine Crackers.

## 2020-07-09 NOTE — ED Triage Notes (Signed)
Pt c/o generalized abdominal pain and bloating that started today

## 2021-09-07 IMAGING — CT CT ABD-PELV W/ CM
3 of 5 series · 16 of 46 positions shown, 18 images · IV contrast (OMNIPAQUE 300)
Comparison: None.

CLINICAL DATA: Abdominal pain.  History of hernia repair

EXAM:
CT ABDOMEN AND PELVIS WITH CONTRAST
TECHNIQUE: Multidetector CT imaging of the abdomen and pelvis was performed
using the standard protocol following bolus administration of
intravenous contrast.
CONTRAST:  100mL OMNIPAQUE IOHEXOL 300 MG/ML  SOLN

[Series 2: axial st · axial · 0.87mm/px · z∈[-626,-246]mm · 12 of 90 slices shown, 14 images]
[im 7/90  soft-tissue]
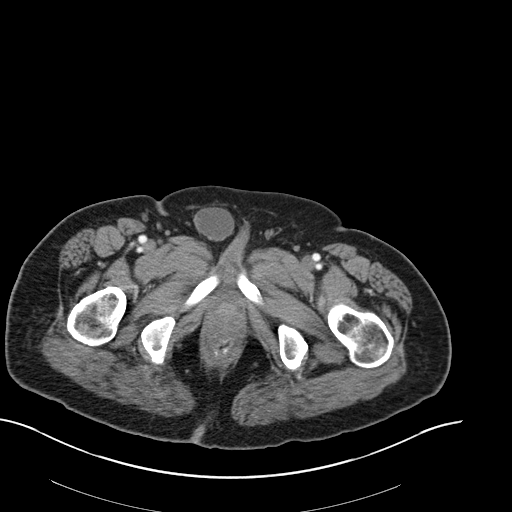
[im 7/90  bone]
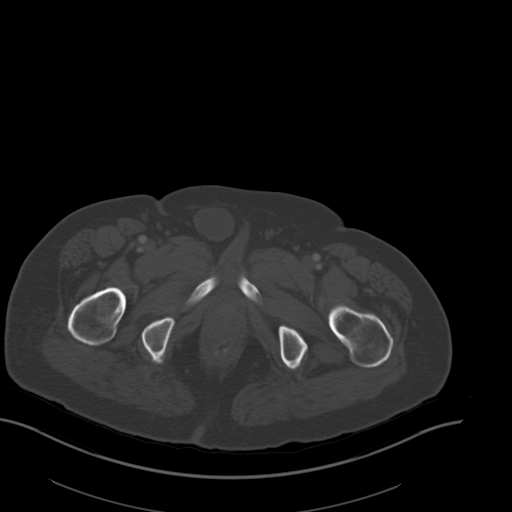
[im 14/90  soft-tissue]
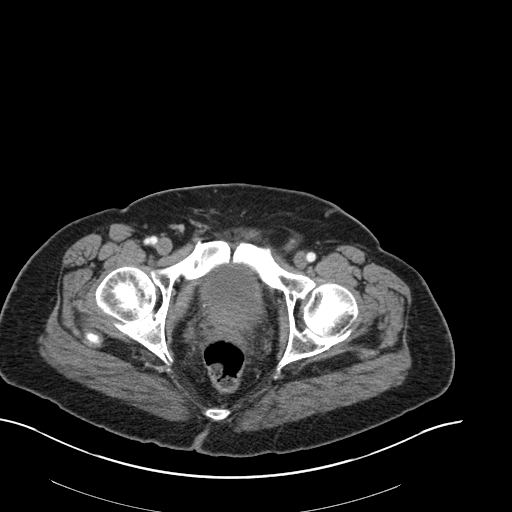
[im 21/90  soft-tissue]
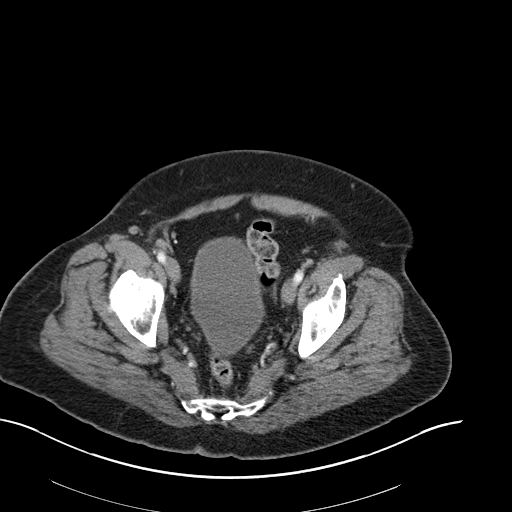
[im 28/90  soft-tissue]
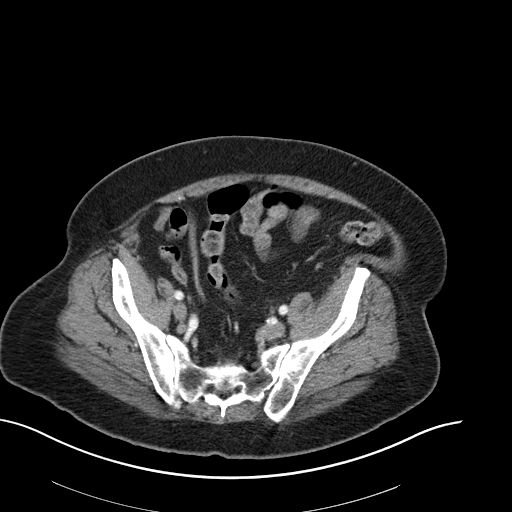
[im 35/90  soft-tissue]
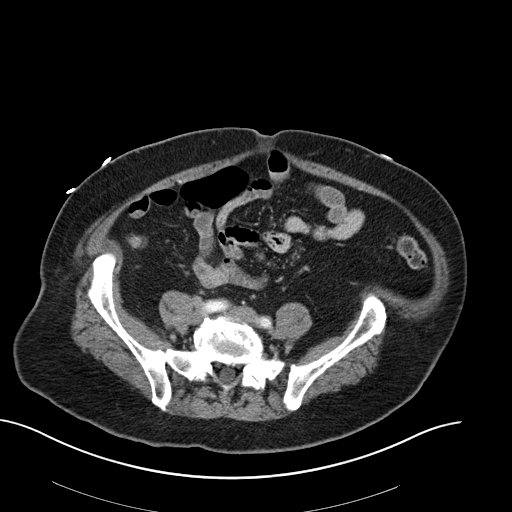
[im 42/90  soft-tissue]
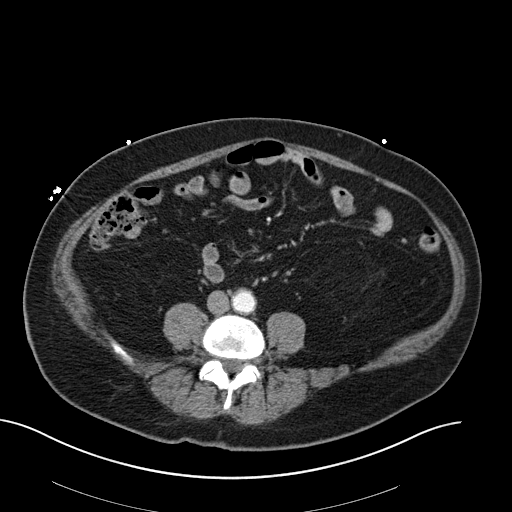
[im 48/90  soft-tissue]
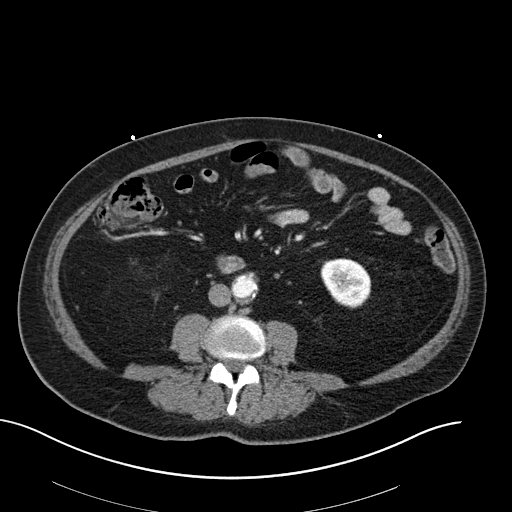
[im 55/90  soft-tissue]
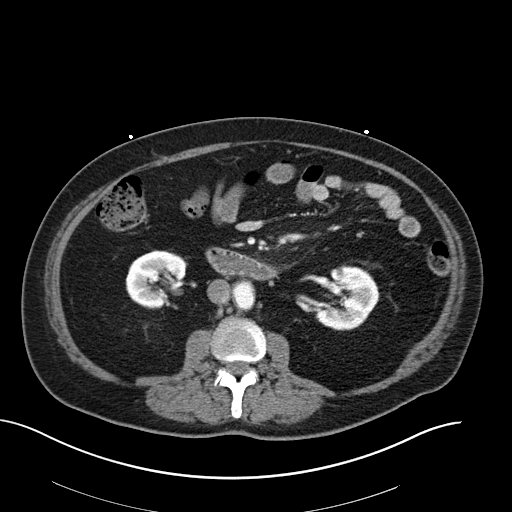
[im 62/90  soft-tissue]
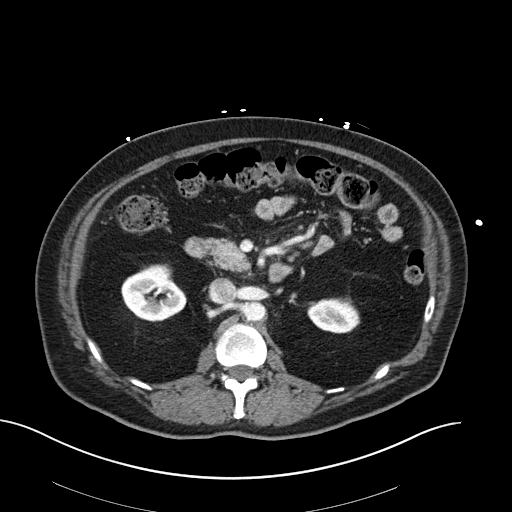
[im 62/90  bone]
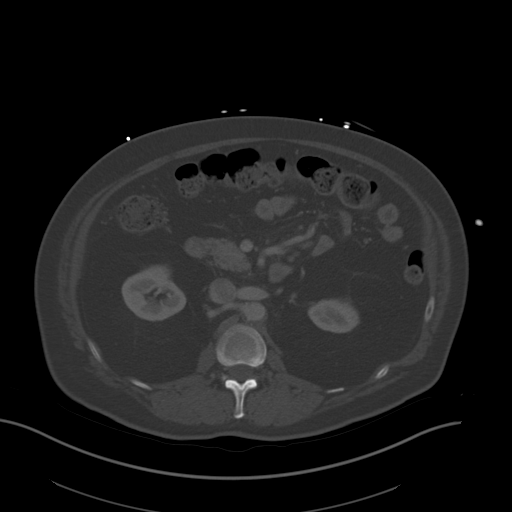
[im 69/90  soft-tissue]
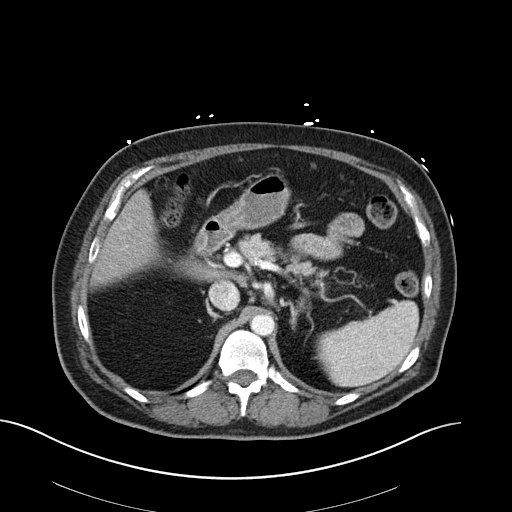
[im 76/90  soft-tissue]
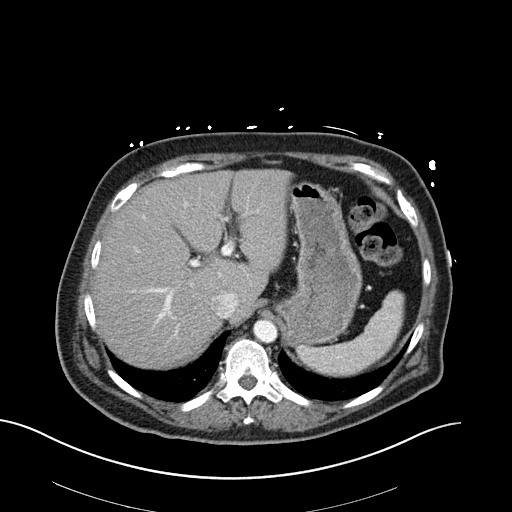
[im 83/90  soft-tissue]
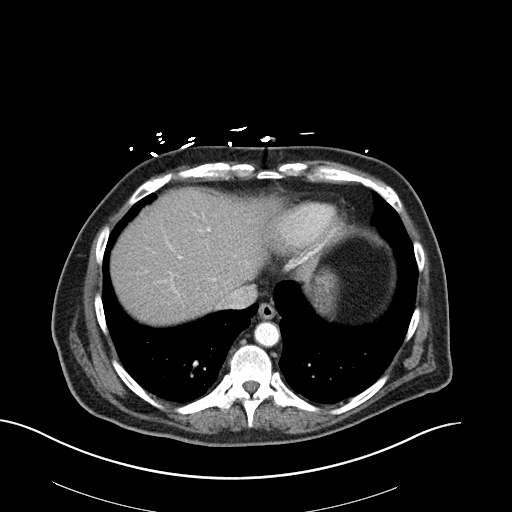

[Series 4: lung bases · axial · 0.87mm/px · 1 of 76 slices shown]
[im 7/76  bone]
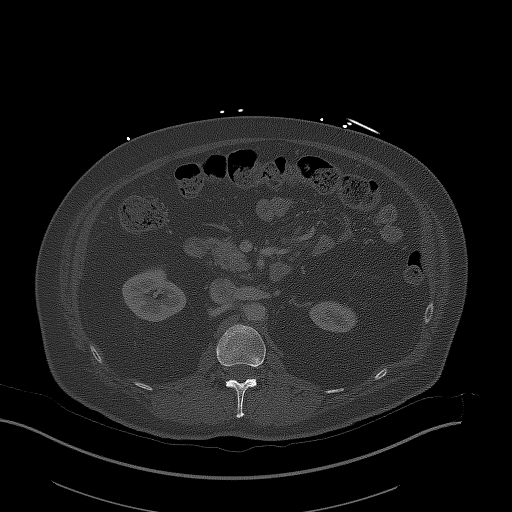

[Series 5: coronal st · coronal · 0.85mm/px · 3 of 155 slices shown]
[im 52/155  soft-tissue]
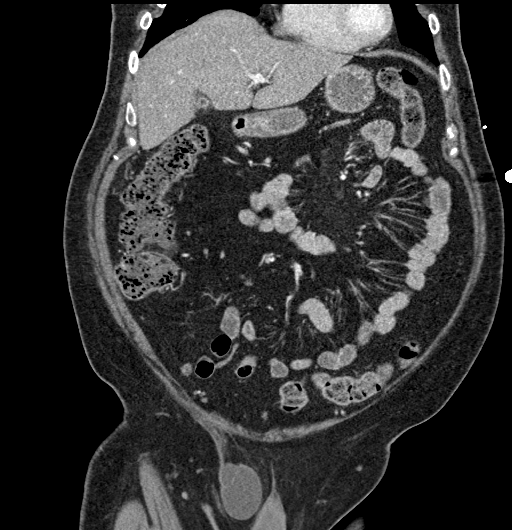
[im 69/155  soft-tissue]
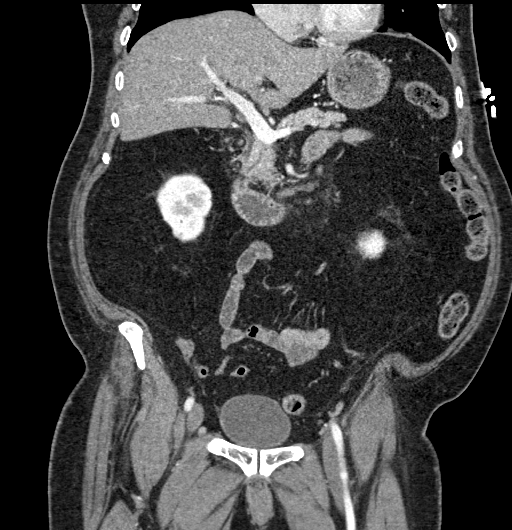
[im 86/155  soft-tissue]
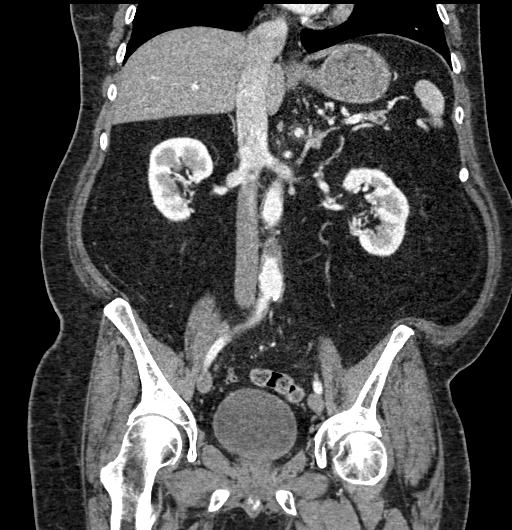

[16 of 46 positions shown; findings below may reference images not displayed]

FINDINGS: Lower chest: Lung bases are clear.

Hepatobiliary: No focal hepatic lesion. Gallbladder is collapsed. No
biliary duct dilatation. Common bile duct is normal.

Pancreas: Multi cystic lesion in the tail of the pancreas measuring
24 mm x 11 mm (image [DATE]). No clear communication with the
pancreatic duct. No comparison available. No downstream ductal
dilatation. The pancreatic head is normal.

Spleen: Normal spleen

Adrenals/urinary tract: Adrenal glands and kidneys are normal. The
ureters and bladder normal.

Stomach/Bowel: Stomach, small bowel, appendix, and cecum are normal.
The colon and rectosigmoid colon are normal.

Vascular/Lymphatic: Abdominal aorta is normal caliber with
atherosclerotic calcification. There is no retroperitoneal or
periportal lymphadenopathy. No pelvic lymphadenopathy.

Reproductive: Prostate unremarkable

Other: Ovoid fluid collection within within the RIGHT inguinal canal
measuring 4.2 by 3.3 by 2.8 cm in diameter. (Coronal image 53/5 and
axial image 85/2). There is no hernia identified.

Musculoskeletal: No aggressive osseous lesion.
IMPRESSION: 1. Ovoid fluid collection in the RIGHT inguinal canal. Findings
similar to scrotal ultrasound from 8854.
2. No inguinal hernia or ventral hernia identified.
3. Multi cystic lesion in the tail of the pancreas. Favor a
pancreatic cysts over duct dilatation. Recommend MRI without and
with contrast in 6 months for further characterization. This
recommendation follows ACR consensus guidelines: Management of
Incidental Pancreatic Cysts: A White Paper of the ACR Incidental
Findings Committee. [HOSPITAL] 7420;[DATE].

## 2021-09-08 ENCOUNTER — Other Ambulatory Visit: Payer: Self-pay | Admitting: Urology

## 2021-09-08 DIAGNOSIS — C61 Malignant neoplasm of prostate: Secondary | ICD-10-CM

## 2021-09-14 ENCOUNTER — Ambulatory Visit
Admission: RE | Admit: 2021-09-14 | Discharge: 2021-09-14 | Disposition: A | Discharge status: Home / Self Care | Payer: Self-pay | Source: Ambulatory Visit | Attending: Radiation Oncology | Admitting: Radiation Oncology

## 2021-09-14 ENCOUNTER — Other Ambulatory Visit: Payer: Self-pay | Admitting: Radiation Oncology

## 2021-09-14 DIAGNOSIS — C61 Malignant neoplasm of prostate: Secondary | ICD-10-CM

## 2021-10-15 ENCOUNTER — Other Ambulatory Visit: Payer: Self-pay | Admitting: Urology

## 2021-11-01 NOTE — Patient Instructions (Signed)
2  VISITORS ARE ALLOWED TO COME WITH YOU AND STAY IN THE WAITING ROOM ONLY DURING PRE OP AND PROCEDURE.   ? ?**NO VISITORS ARE ALLOWED IN THE SHORT STAY AREA OR RECOVERY ROOM!!** ? ?IF YOU WILL BE ADMITTED INTO THE HOSPITAL YOU ARE ALLOWED 4 SUPPORT PEOPLE DURING VISITATION HOURS ONLY (7 AM -8PM)   ?The support person(s) must pass our screening, gel in and out, and wear a mask at all times, including in the patient?s room. ?Patients must also wear a mask when staff or their support person are in the room. ?Visitors GUEST BADGE MUST BE WORN VISIBLY  ?One adult visitor may remain with you overnight and MUST be in the room by 8 P.M. ?  ? ? Your procedure is scheduled on: 11/12/21 ? ? Report to [facility] Main Entrance ? ?  Report to admitting at 6:45 AM ? ? Call this number if you have problems the morning of surgery [phone] ? ? Do not eat food  or drink :After Midnight. ? ? ?  ?       If you have questions, please contact your surgeon?s office. ? ? ?  ?  ?Oral Hygiene is also important to reduce your risk of infection.                                    ?Remember - BRUSH YOUR TEETH THE MORNING OF SURGERY WITH YOUR REGULAR TOOTHPASTE ? ? Do NOT smoke after Midnight ? ? Take these medicines the morning of surgery with A SIP OF WATER: Phenelzine, Omeprazole ? ? ?Bring CPAP mask and tubing day of surgery. ?                  ?           You may not have any metal on your body including  jewelry, and body piercing ? ?           Do not wear lotions, powders, perfumes/cologne, or deodorant ? ?            Men may shave face and neck. ? ? Do not bring valuables to the hospital. [facility] NOT ?            RESPONSIBLE   FOR VALUABLES. ? ? Contacts, dentures or bridgework may not be worn into surgery. ? ?  ? Patients discharged on the day of surgery will not be allowed to drive home.  Someone NEEDS to stay with you for the first 24 hours after anesthesia. ? ? Special Instructions: Bring a copy of your healthcare  power of attorney and living will documents the day of surgery if you haven't scanned them before. ? ?            Please read over the following fact sheets you were given: IF [place] [phone] ? ?   [facility] - Preparing for Surgery ?Before surgery, you can play an important role.  Because skin is not sterile, your skin needs to be as free of germs as possible.  You can reduce the number of germs on your skin by washing with CHG (chlorahexidine gluconate) soap before surgery.  CHG is an antiseptic cleaner which kills germs and bonds with the skin to continue killing germs even after washing. ?Please DO NOT use if you have an allergy to  CHG or antibacterial soaps.  If your skin becomes reddened/irritated stop using the CHG and inform your nurse when you arrive at Short Stay.  You may shave your face/neck. ?Please follow these instructions carefully: ? 1.  Shower with CHG Soap the night before surgery and the  morning of Surgery. ? 2.  If you choose to wash your hair, wash your hair first as usual with your  normal  shampoo. ? 3.  After you shampoo, rinse your hair and body thoroughly to remove the  shampoo.              ?   4.  Use CHG as you would any other liquid soap.  You can apply chg directly  to the skin and wash  ?                     Gently with a scrungie or clean washcloth. ? 5.  Apply the CHG Soap to your body ONLY FROM THE NECK DOWN.   Do not use on face/ open      ?                     Wound or open sores. Avoid contact with eyes, ears mouth and genitals (private parts).  ?                     Production manager,  Genitals (private parts) with your normal soap. ?            6.  Wash thoroughly, paying special attention to the area where your surgery  will be performed. ? 7.  Thoroughly rinse your body with warm water from the neck down. ? 8.  DO NOT shower/wash with your normal soap after using and rinsing off  the CHG Soap. ?               9.  Pat  yourself dry with a clean towel. ?           10.  Wear clean pajamas. ?           11.  Place clean sheets on your bed the night of your first shower and do not  sleep with pets. ?Day of Surgery : ?Do not apply any lotions/deodorants the morning of surgery.  Please wear clean clothes to the hospital/surgery center. ? ?FAILURE TO FOLLOW THESE INSTRUCTIONS MAY RESULT IN THE CANCELLATION OF YOUR SURGERY ? ? ? ?________________________________________________________________________  ?

## 2021-11-02 ENCOUNTER — Other Ambulatory Visit: Payer: Self-pay

## 2021-11-02 ENCOUNTER — Encounter (HOSPITAL_COMMUNITY): Payer: Self-pay

## 2021-11-02 ENCOUNTER — Encounter (HOSPITAL_COMMUNITY)
Admission: RE | Admit: 2021-11-02 | Discharge: 2021-11-02 | Disposition: A | Discharge status: Home / Self Care | Payer: Medicare PPO | Source: Ambulatory Visit | Attending: Urology | Admitting: Urology

## 2021-11-02 VITALS — Ht 66.0 in | Wt 142.0 lb

## 2021-11-02 DIAGNOSIS — Z01818 Encounter for other preprocedural examination: Secondary | ICD-10-CM

## 2021-11-02 HISTORY — DX: Essential (primary) hypertension: I10

## 2021-11-02 NOTE — Progress Notes (Signed)
Anesthesia note: ? ?Bowel prep reminder:none ? ?PCP - Dr. Billey Gosling [facility] in [place], [place] ?Cardiologist -none ?Other-  ? ?Chest x-ray - no ?EKG - no ?Stress Test - no ?ECHO - no ?Cardiac Cath - no ? ?Pacemaker/ICD device last checked:NA ? ?Sleep Study - no ?CPAP -  ? ?Pt is pre diabetic-no ?Fasting Blood Sugar -  ?Checks Blood Sugar _____ ? ?Blood Thinner:NA ?Blood Thinner Instructions: ?Aspirin Instructions: ?Last Dose: ? ?Anesthesia review: no ? ?Patient denies shortness of breath, fever, cough and chest pain at PAT appointment ?Pt is from [place], [place] but staying with a friend in [place]. Pt had a slight fever and some congestion and not able to come to his PAT visit. It was done over the phone and he will have labs and EKG done on DOS. ? ?Patient verbalized understanding of instructions that were given to them at the PAT appointment. Patient was also instructed that they will need to review over the PAT instructions again at home before surgery. Instructions were read over the phone with his friend on speaker and they said they understood them. ?

## 2021-11-12 ENCOUNTER — Telehealth: Payer: Self-pay | Admitting: Student

## 2021-11-12 ENCOUNTER — Encounter (HOSPITAL_COMMUNITY): Payer: Self-pay | Admitting: Urology

## 2021-11-12 ENCOUNTER — Ambulatory Visit (HOSPITAL_BASED_OUTPATIENT_CLINIC_OR_DEPARTMENT_OTHER): Payer: Medicare PPO | Admitting: Anesthesiology

## 2021-11-12 ENCOUNTER — Other Ambulatory Visit: Payer: Self-pay

## 2021-11-12 ENCOUNTER — Ambulatory Visit (HOSPITAL_COMMUNITY): Payer: Medicare PPO | Admitting: Anesthesiology

## 2021-11-12 ENCOUNTER — Encounter (HOSPITAL_COMMUNITY)
Admission: RE | Disposition: A | Discharge status: Home / Self Care | Payer: Self-pay | Source: Home / Self Care | Attending: Urology

## 2021-11-12 ENCOUNTER — Ambulatory Visit (HOSPITAL_COMMUNITY)
Admission: RE | Admit: 2021-11-12 | Discharge: 2021-11-12 | Disposition: A | Discharge status: Home / Self Care | Payer: Medicare PPO | Attending: Urology | Admitting: Urology

## 2021-11-12 DIAGNOSIS — C61 Malignant neoplasm of prostate: Secondary | ICD-10-CM | POA: Diagnosis present

## 2021-11-12 DIAGNOSIS — Z79899 Other long term (current) drug therapy: Secondary | ICD-10-CM | POA: Insufficient documentation

## 2021-11-12 DIAGNOSIS — I10 Essential (primary) hypertension: Secondary | ICD-10-CM | POA: Insufficient documentation

## 2021-11-12 DIAGNOSIS — F32A Depression, unspecified: Secondary | ICD-10-CM | POA: Insufficient documentation

## 2021-11-12 DIAGNOSIS — D649 Anemia, unspecified: Secondary | ICD-10-CM | POA: Diagnosis not present

## 2021-11-12 DIAGNOSIS — F419 Anxiety disorder, unspecified: Secondary | ICD-10-CM | POA: Diagnosis not present

## 2021-11-12 DIAGNOSIS — Z01818 Encounter for other preprocedural examination: Secondary | ICD-10-CM

## 2021-11-12 HISTORY — PX: CRYOABLATION: SHX1415

## 2021-11-12 LAB — CBC
HCT: 36.9 % — ABNORMAL LOW (ref 39.0–52.0)
Hemoglobin: 12.6 g/dL — ABNORMAL LOW (ref 13.0–17.0)
MCH: 31.3 pg (ref 26.0–34.0)
MCHC: 34.1 g/dL (ref 30.0–36.0)
MCV: 91.6 fL (ref 80.0–100.0)
Platelets: 265 10*3/uL (ref 150–400)
RBC: 4.03 MIL/uL — ABNORMAL LOW (ref 4.22–5.81)
RDW: 11.7 % (ref 11.5–15.5)
WBC: 9.1 10*3/uL (ref 4.0–10.5)
nRBC: 0 % (ref 0.0–0.2)

## 2021-11-12 LAB — BASIC METABOLIC PANEL
Anion gap: 7 (ref 5–15)
BUN: 21 mg/dL (ref 8–23)
CO2: 29 mmol/L (ref 22–32)
Calcium: 8.8 mg/dL — ABNORMAL LOW (ref 8.9–10.3)
Chloride: 104 mmol/L (ref 98–111)
Creatinine, Ser: 0.85 mg/dL (ref 0.61–1.24)
GFR, Estimated: >60 mL/min (ref >60)
Glucose, Bld: 87 mg/dL (ref 70–99)
Potassium: 3.9 mmol/L (ref 3.5–5.1)
Sodium: 140 mmol/L (ref 135–145)

## 2021-11-12 SURGERY — CRYOABLATION, PROSTATE
Anesthesia: General

## 2021-11-12 MED ORDER — DEXAMETHASONE SODIUM PHOSPHATE 10 MG/ML IJ SOLN
INTRAMUSCULAR | Status: DC | PRN
  Administered 2021-11-12: 10 mg via INTRAVENOUS

## 2021-11-12 MED ORDER — FENTANYL CITRATE PF 50 MCG/ML IJ SOSY: 25.0000 ug | PREFILLED_SYRINGE | INTRAMUSCULAR | Status: DC | PRN

## 2021-11-12 MED ORDER — OXYBUTYNIN CHLORIDE ER 5 MG PO TB24: 5.0000 mg | ORAL_TABLET | Freq: Every day | ORAL | 0 refills | Status: AC

## 2021-11-12 MED ORDER — PROPOFOL 10 MG/ML IV BOLUS
INTRAVENOUS | Status: DC | PRN
  Administered 2021-11-12: 150 mg via INTRAVENOUS

## 2021-11-12 MED ORDER — ORAL CARE MOUTH RINSE: 15.0000 mL | Freq: Once | OROMUCOSAL | Status: AC

## 2021-11-12 MED ORDER — FENTANYL CITRATE (PF) 100 MCG/2ML IJ SOLN
INTRAMUSCULAR | Status: DC | PRN
Start: 2021-11-12 — End: 2021-11-12
  Administered 2021-11-12: 100 ug via INTRAVENOUS
  Administered 2021-11-12: 50 ug via INTRAVENOUS

## 2021-11-12 MED ORDER — LACTATED RINGERS IV SOLN: INTRAVENOUS | Status: DC

## 2021-11-12 MED ORDER — ONDANSETRON HCL 4 MG/2ML IJ SOLN
INTRAMUSCULAR | Status: AC
  Filled 2021-11-12: qty 2

## 2021-11-12 MED ORDER — LIDOCAINE 2% (20 MG/ML) 5 ML SYRINGE
INTRAMUSCULAR | Status: DC | PRN
  Administered 2021-11-12: 60 mg via INTRAVENOUS
  Administered 2021-11-12: 40 mg via INTRAVENOUS

## 2021-11-12 MED ORDER — SODIUM CHLORIDE 0.9 % IR SOLN
Status: DC | PRN
  Administered 2021-11-12: 1000 mL

## 2021-11-12 MED ORDER — PHENYLEPHRINE HCL (PRESSORS) 10 MG/ML IV SOLN
INTRAVENOUS | Status: DC | PRN
  Administered 2021-11-12 (×2): 160 ug via INTRAVENOUS
  Administered 2021-11-12: 240 ug via INTRAVENOUS

## 2021-11-12 MED ORDER — AMISULPRIDE (ANTIEMETIC) 5 MG/2ML IV SOLN: 10.0000 mg | Freq: Once | INTRAVENOUS | Status: DC | PRN

## 2021-11-12 MED ORDER — CHLORHEXIDINE GLUCONATE 0.12 % MT SOLN
15.0000 mL | Freq: Once | OROMUCOSAL | Status: AC
  Administered 2021-11-12: 15 mL via OROMUCOSAL

## 2021-11-12 MED ORDER — KETOROLAC TROMETHAMINE 15 MG/ML IJ SOLN: 15.0000 mg | Freq: Once | INTRAMUSCULAR | Status: DC | PRN

## 2021-11-12 MED ORDER — FENTANYL CITRATE (PF) 100 MCG/2ML IJ SOLN
INTRAMUSCULAR | Status: AC
  Filled 2021-11-12: qty 2

## 2021-11-12 MED ORDER — CEFAZOLIN SODIUM-DEXTROSE 2-4 GM/100ML-% IV SOLN
2.0000 g | INTRAVENOUS | Status: AC
  Administered 2021-11-12: 2 g via INTRAVENOUS
  Filled 2021-11-12: qty 100

## 2021-11-12 MED ORDER — ONDANSETRON HCL 4 MG/2ML IJ SOLN
INTRAMUSCULAR | Status: DC | PRN
  Administered 2021-11-12: 4 mg via INTRAVENOUS

## 2021-11-12 MED ORDER — OXYBUTYNIN CHLORIDE ER 5 MG PO TB24: 5.0000 mg | ORAL_TABLET | Freq: Every day | ORAL | 0 refills | Status: DC

## 2021-11-12 MED ORDER — PROPOFOL 10 MG/ML IV BOLUS
INTRAVENOUS | Status: AC
  Filled 2021-11-12: qty 20

## 2021-11-12 MED ORDER — OXYCODONE HCL 5 MG PO TABS
5.0000 mg | ORAL_TABLET | ORAL | 0 refills | Status: DC | PRN
Start: 2021-11-12 — End: 2021-11-12

## 2021-11-12 MED ORDER — ACETAMINOPHEN 10 MG/ML IV SOLN: 1000.0000 mg | Freq: Once | INTRAVENOUS | Status: DC | PRN

## 2021-11-12 MED ORDER — ROCURONIUM BROMIDE 100 MG/10ML IV SOLN
INTRAVENOUS | Status: DC | PRN
  Administered 2021-11-12: 40 mg via INTRAVENOUS
  Administered 2021-11-12 (×3): 20 mg via INTRAVENOUS

## 2021-11-12 MED ORDER — SUGAMMADEX SODIUM 200 MG/2ML IV SOLN
INTRAVENOUS | Status: DC | PRN
Start: 2021-11-12 — End: 2021-11-12
  Administered 2021-11-12: 200 mg via INTRAVENOUS
  Administered 2021-11-12: 100 mg via INTRAVENOUS

## 2021-11-12 MED ORDER — ONDANSETRON HCL 4 MG/2ML IJ SOLN: 4.0000 mg | Freq: Once | INTRAMUSCULAR | Status: DC | PRN

## 2021-11-12 MED ORDER — BACITRACIN ZINC 500 UNIT/GM EX OINT
TOPICAL_OINTMENT | CUTANEOUS | Status: AC
  Filled 2021-11-12: qty 28.35

## 2021-11-12 MED ORDER — BACITRACIN ZINC 500 UNIT/GM EX OINT
TOPICAL_OINTMENT | CUTANEOUS | Status: DC | PRN
  Administered 2021-11-12: 1 via TOPICAL

## 2021-11-12 MED ORDER — OXYCODONE HCL 5 MG PO TABS
5.0000 mg | ORAL_TABLET | ORAL | 0 refills | Status: AC | PRN
Start: 2021-11-12 — End: ?

## 2021-11-12 MED ORDER — DEXAMETHASONE SODIUM PHOSPHATE 10 MG/ML IJ SOLN
INTRAMUSCULAR | Status: AC
  Filled 2021-11-12: qty 1

## 2021-11-12 SURGICAL SUPPLY — 25 items
BAG URINE DRAIN 2000ML AR STRL (UROLOGICAL SUPPLIES) ×2 IMPLANT
BNDG GAUZE ELAST 4 BULKY (GAUZE/BANDAGES/DRESSINGS) ×1 IMPLANT
CATH FOLEY 2WAY SLVR  5CC 18FR (CATHETERS) ×2
CATH FOLEY 2WAY SLVR 5CC 18FR (CATHETERS) ×2 IMPLANT
COVER SURGICAL LIGHT HANDLE (MISCELLANEOUS) ×2 IMPLANT
CRYO ENDOCARE FOR PROSTATE (LABOR (TRAVEL & OVERTIME)) ×1
CRYO ENDOCARE PROSTATE CRYO101 (LABOR (TRAVEL & OVERTIME)) ×1 IMPLANT
DRAPE INCISE IOBAN 66X45 STRL (DRAPES) ×1 IMPLANT
DRSG TEGADERM 4X4.75 (GAUZE/BANDAGES/DRESSINGS) ×1 IMPLANT
DRSG TELFA 3X8 NADH (GAUZE/BANDAGES/DRESSINGS) ×2 IMPLANT
GAS ARGON HIGH PRESSURE (MEDICAL GASES) ×2 IMPLANT
GAS HELIUM HIGH PRESSURE (MEDICAL GASES) ×2 IMPLANT
GLOVE SURG LX 7.5 STRW (GLOVE) ×1
GLOVE SURG LX STRL 7.5 STRW (GLOVE) ×1 IMPLANT
GOWN STRL REUS W/ TWL XL LVL3 (GOWN DISPOSABLE) ×1 IMPLANT
GOWN STRL REUS W/TWL XL LVL3 (GOWN DISPOSABLE) ×1
GUIDEWIRE AMPLATZ STIFF 0.35 (WIRE) ×2 IMPLANT
KIT EC CRYO PROSTATE CRYO207V (DISPOSABLE) ×1 IMPLANT
PACK CYSTO (CUSTOM PROCEDURE TRAY) ×2 IMPLANT
PAD DRESSING TELFA 3X8 NADH (GAUZE/BANDAGES/DRESSINGS) IMPLANT
PENCIL SMOKE EVACUATOR (MISCELLANEOUS) IMPLANT
PLUG CATH AND CAP STER (CATHETERS) ×2 IMPLANT
PROCEDURE CRYO ENDOCARE PROSTE (LABOR (TRAVEL & OVERTIME)) IMPLANT
SHEET LAVH (DRAPES) ×1 IMPLANT
TOWEL OR 17X26 10 PK STRL BLUE (TOWEL DISPOSABLE) ×2 IMPLANT

## 2021-11-12 NOTE — Transfer of Care (Signed)
Immediate Anesthesia Transfer of Care Note ? ?Patient: Shane Hendricks ? ?Procedure(s) Performed: CRYO ABLATION PROSTATE ? ?Patient Location: PACU ? ?Anesthesia Type:General ? ?Level of Consciousness: awake, oriented and drowsy ? ?Airway & Oxygen Therapy: Patient Spontanous Breathing and Patient connected to face mask oxygen ? ?Post-op Assessment: Report given to RN and Post -op Vital signs reviewed and stable ? ?Post vital signs: Reviewed and stable ? ?Last Vitals:  ?Vitals Value Taken Time  ?BP 165/91 11/12/21 1135  ?Temp    ?Pulse 74 11/12/21 1137  ?Resp 16 11/12/21 1137  ?SpO2 100 % 11/12/21 1137  ?Vitals shown include unvalidated device data. ? ?Last Pain:  ?Vitals:  ? 11/12/21 0745  ?TempSrc:   ?PainSc: 0-No pain  ?   ? ?Patients Stated Pain Goal: 3 (11/12/21 0745) ? ?Complications: No notable events documented. ?

## 2021-11-12 NOTE — Anesthesia Postprocedure Evaluation (Signed)
Anesthesia Post Note ? ?Patient: Shane Hendricks ? ?Procedure(s) Performed: CRYO ABLATION PROSTATE ? ?  ? ?Patient location during evaluation: PACU ?Anesthesia Type: General ?Level of consciousness: awake ?Pain management: pain level controlled ?Vital Signs Assessment: post-procedure vital signs reviewed and stable ?Respiratory status: spontaneous breathing, nonlabored ventilation, respiratory function stable and patient connected to nasal cannula oxygen ?Cardiovascular status: blood pressure returned to baseline and stable ?Postop Assessment: no apparent nausea or vomiting ?Anesthetic complications: no ? ? ?No notable events documented. ? ?Last Vitals:  ?Vitals:  ? 11/12/21 1200 11/12/21 1211  ?BP: (!) 170/89   ?Pulse: 62 (!) 58  ?Resp: 18 10  ?Temp: (!) 36.4 ?C   ?SpO2: 99% 100%  ?  ?Last Pain:  ?Vitals:  ? 11/12/21 1211  ?TempSrc:   ?PainSc: 0-No pain  ? ? ?  ?  ?  ?  ?  ?  ? ?Emberli Ballester P Timmie Calix ? ? ? ? ?

## 2021-11-12 NOTE — Discharge Instructions (Addendum)
INSTRUCTIONS AFTER CRYOABLATION OF THE PROSTATE ? ?Normal Findings After Cryoablation: ? ? You may experience a number of symptoms after the cryoablation which occur in  some patients.  There is no cause for alarm should this happen.  These    symptoms include: ? -Blood in the urine.  When you are discharged after your surgery, your urine   will have some blood in it and may appear red.  This occurs to some degree in all patients after cryoablation and is not cause for alarm.  Your urine should clear approximately 24 hours after the procedure, but may persist for quite a while longer. ? -Scrotal and penile swelling and bruising.  This occurs about 2-3 days after   the cryoablation and is caused by tissue swelling which temporarily blocks the drainage of lymph.  This is painless and resolves in less than one week.  Ice packs and lying down for short periods of time during the day will improve the swelling. ? -Small amounts of bloody discharge from the end of your penis.  This can  occur for up to 6 weeks after the procedure and is not cause for alarm.  It is due  to some discharge from the urethra in the area of the prostate. ? -Some numbness in the head of the penis.  Occasionally, when a large  amount of freezing has been performed during the procedure, the nerve which  supplies sensation to one or both sides of the head of the penis may be affected. The sensation returns after a number of months. ? ?Call your doctor at [phone] if any of the following occur: ?  ?            -If you have any pain, fever, or chills. ? -If your foley catheter or suprapubic tube is not draining urine. ? -If there is decreasing urinary stream.  This may indicate sloughing of some  dead tissue in the area of the prostate near the opening to the bladder.  It may clear on its own but,  if the problem becomes severe, it may require removal of the dead tissue through a cystoscope. ? -If you have diarrhea after urination or foul-smelling  urine.  These   symptoms may indicate an urethrorectal fistula, which is a hole between the bladder channel and the rectum.  This should be investigated by your doctor. ? -If you have any questions or problems.  ? ?Diet: ? Resume your normal diet.   If you become constipated, you may try  over-the-counter remedies such as Milk of Magnesia.  If you are nauseated, vomiting or feel bloated, notify your doctor's office.  DO NOT GIVE YOURSELF ANY ENEMAS OR RECTAL MEDICATIONS.  THE RECTAL WALL IS THIN AFTER CRYOABLATION. ? ?Activity:  ? -You may have swelling and bruising of the penis and scrotum.  Apply ice packs   to the area behind the scrotum intermittently for 24-48 hours after your surgery to keep the swelling down.  Wearing an athletic supporter (jock strap) might also help. ? -Lying flat on your back will also help decrease the swelling.  You may find   when you are sitting up or walking around that the swelling increases.  ? -You will have  puncture wounds behind your scrotum making it painful to sit   down.  Use a hemorrhoid donut to sit on if needed. ? -You may shower on the second day after surgery. ? -There are no lifting or driving restrictions.  Use caution while the suprapubic tube   is in place.  ? ?Medications: ? -You may resume your preoperative medications, except aspirin and other blood   thinning agents.  Unless otherwise instructed, resume your aspirin after your suprapubic tube is removed.  If you are taking Coumadin or other blood thinners, please discuss when to restart these medications with your surgeon. ? -Unless otherwise ordered, you will be given prescriptions for the following   medications which you will need to take after your procedure: ?  *Antibiotics -  to prevent infection while your suprapubic tube is in place. ?  *Anti-inflammatory - to prevent pain and to decrease the inflammation in   the area of the prostate.  You may take ibuprofen (Motrin, Advil),    acetaminophen (Tylenol) or  naproxen (Aleve) as needed for discomfort.  ? ?

## 2021-11-12 NOTE — Interval H&P Note (Signed)
History and Physical Interval Note: ? ?11/12/2021 ?7:37 AM ? ?Shane Hendricks  has presented today for surgery, with the diagnosis of PROSTATE CANCER.  The various methods of treatment have been discussed with the patient and family. After consideration of risks, benefits and other options for treatment, the patient has consented to  Procedure(s): ?CRYO ABLATION PROSTATE (N/A) as a surgical intervention.  The patient's history has been reviewed, patient examined, no change in status, stable for surgery.  I have reviewed the patient's chart and labs.  Questions were answered to the patient's satisfaction.   ? ? ?Marton Redwood, III ? ? ?

## 2021-11-12 NOTE — H&P (Signed)
CC/HPI: CC: Prostate cancer  ?HPI:  ?09/06/2021  ?71 year old male diagnosed with prostate cancer by Dr. Tamala Julian at [facility] urology in [place]. He had a PSA of 4.9. Prostate volume 23.77. Unfortunately, prostate biopsy revealed adenocarcinoma the prostate in 5 out of 12 cores. Maximum Gleason score was 4 +4. He underwent a CT scan and bone scan that were reportedly negative. He was initially diagnosed in November. After discussion of different options, he was most interested in cryotherapy.  ? ?11/01/2021  ?Patient underwent an MRI of the prostate. This revealed a PI-RADS 3 lesion. No evidence of extraprostatic extension or lymphadenopathy. Patient also underwent a PSMA PET scan that revealed 2 nodular foci of increased uptake in the prostate. There was no evidence of metastatic disease. He presents today to discuss cryotherapy which he is already scheduled for on the first and to start androgen deprivation therapy which we will plan to continue for a total of 6 months.  ? ?  ?ALLERGIES: Any med that conflicts with MAOI per pt ?Aspirin TABS ?Tyramine HCl POWD ?  ? ?MEDICATIONS: Lisinopril  ?Alprazolam 0.5 mg tablet  ?ALPRAZolam 0.5 MG Oral Tablet Oral  ?Antacid Chews  ?Colace CAPS Oral  ?Ibuprofen 200 MG Oral Tablet 0 Oral  ?Nardil 15 mg tablet 0 Oral  ?Phenelzine Sulfate 15 mg tablet  ?  ? ?GU PSH: No GU PSH   ?   ?PSH Notes: Inguinal Hernia Repair 2014  ? ?NON-GU PSH: No Non-GU PSH   ? ?GU PMH: Prostate Cancer - 09/06/2021 ?Spermatocele of epididymis, Unspec, Spermatocele - 2014 ?History of prostate cancer ?  ?   ?PMH Notes:  ?1898-07-15 00:00:00 - Note: Normal Routine History And Physical Adult  ?2012-10-29 13:29:06 - Note: Mitral Valve Disorder ?heart murmur  ? ?Barretts esophagitis  ? ?NON-GU PMH: Anxiety, Anxiety (Symptom) - 2014 ?Cardiac murmur, unspecified, Murmurs - 2014 ?Personal history of other mental and behavioral disorders, History of depression - 2014 ?Depression ?GERD ?Hypertension ?  ? ?FAMILY  HISTORY: Anxiety (Symptom) - Runs In Family ?Aortic Aneurysm - Father ?Congestive Heart Failure - Mother ?Death In The Family Father - Runs In Family ?Death In The Family Mother - Runs In Family ?Diabetes - Mother, Brother ?Family Health Status Children is 1 daughter and 1 - Runs In Family ?Hypertension - Runs In Family ?Kidney Cancer - Brother ?nephrolithiasis - Brother ?Stroke Syndrome - Mother  ?  Notes: 1 son  ? ?SOCIAL HISTORY: Marital Status: Divorced ?Preferred Language: English ?Current Smoking Status: Patient has never smoked.  ? ?Tobacco Use Assessment Completed: Used Tobacco in last 30 days? ?Has never drank.  ?Does not drink caffeine. 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 ?  ?  Notes: Marital History - Divorced, Alcohol Use, Never A Smoker, Caffeine Use, Retired From Work  ? ?REVIEW OF SYSTEMS:    ?GU Review Male:   Patient denies frequent urination, hard to postpone urination, burning/ pain with urination, get up at night to urinate, leakage of urine, stream starts and stops, trouble starting your stream, have to strain to urinate , erection problems, and penile pain.  ?Gastrointestinal (Upper):   Patient denies nausea, vomiting, and indigestion/ heartburn.  ?Gastrointestinal (Lower):   Patient denies diarrhea and constipation.  ?Constitutional:   Patient denies fever, night sweats, weight loss, and fatigue.  ?Skin:   Patient denies skin rash/ lesion and itching.  ?Eyes:   Patient denies blurred vision and double vision.  ?Ears/ Nose/ Throat:   Patient denies sore throat  and sinus problems.  ?Hematologic/Lymphatic:   Patient denies swollen glands and easy bruising.  ?Cardiovascular:   Patient denies leg swelling and chest pains.  ?Respiratory:   Patient denies cough and shortness of breath.  ?Endocrine:   Patient denies excessive thirst.  ?Musculoskeletal:   Patient denies back pain and joint pain.  ?Neurological:   Patient denies headaches and dizziness.   ?Psychologic:   Patient denies depression and anxiety.  ? ?VITAL SIGNS: None  ? ?MULTI-SYSTEM PHYSICAL EXAMINATION:    ?Gastrointestinal: No mass, no tenderness, no rigidity, non obese abdomen.  ? ?  ?Complexity of Data:  ?Source Of History:  Patient  ?Records Review:   Previous Doctor Records, Previous Patient Records  ?Urine Test Review:   Urinalysis  ?X-Ray Review: PET Scan: Reviewed Report. Discussed With Patient.  ?MRI Prostate GSORAD: Reviewed Report. Discussed With Patient.  ?  ? ?PROCEDURES:    ? ?     Urinalysis ?Dipstick Dipstick Cont'd  ?Color: Yellow Bilirubin: Neg mg/dL  ?Appearance: Clear Ketones: Neg mg/dL  ?Specific Gravity: 1.025 Blood: Neg ery/uL  ?pH: 6.5 Protein: Neg mg/dL  ?Glucose: Neg mg/dL Urobilinogen: 0.2 mg/dL  ?  Nitrites: Neg  ?  Leukocyte Esterase: Neg leu/uL  ? ? ?     Firmagon '240mg'$  - [id number], [phone] ?Firmagon 120 mg given SQ RU abdomen and Firmagon '120mg'$  given SQ LU abdomen for a total Firmagon '240mg'$   ? ?Qty: 240 Adm. By: Alcide Goodness  ?Unit: mg Lot No [id number]  ?Route: SQ Exp. Date 11/13/2023  ?Freq: None Mfgr.:   ?Site: RU abdomen  ? ?ASSESSMENT:  ?    ICD-10 Details  ?1 GU:   Prostate Cancer - C61 Chronic, Stable  ? ?PLAN:    ? ?      Schedule ?Procedure: Unspecified Date - Cryoablation Prostate - [phone]  ? ? ?      Document ?Letter(s):  Created for Patient: Clinical Summary  ? ? ?     Notes:   We again discussed cryotherapy including the risk of bleeding, infection, blood transfusion, injury to surrounding structures, need for additional procedures, need for prolonged catheterization, chronic pain, fistula formation, obliterated urethra, stricture among other imponderables. Also discussed permanent erectile dysfunction.  ? ?He will follow-up as scheduled for surgery on the first.  ? ?CC: Dr. Zenia Resides  ? ?     Next Appointment:    ?  Next Appointment: 11/12/2021 09:00 AM  ?  Appointment Type: Surgery   ?  Location: [facility] [phone]  ?  Provider: Link Snuffer,  III, M.D.  ?  Reason for Visit: OP WL CRYO PROSTATE  ?  ? ? ?Signed by Link Snuffer, III, M.D. on 11/01/21 at 4:50 PM (EDT ?

## 2021-11-12 NOTE — Telephone Encounter (Signed)
Returned patient phone call to on call pager. ? ?Patient had cryoablation of his prostate earlier today. He is having intermittent blood per rectum and sudden intense sensation to void that resolves. From his description it sounds like he is having bladder spasms. We discussed that this can be normal, but it can also be a sign of his catheter not draining appropriately. He states there is watermelon colored urine is his catheter bag, no clots. We discussed continuing to monitor his urine output to ensure catheter is draining appropriately. Blood per rectum is also normal after todays procedure. Discussed monitoring output for next few hours. If concerned with amount of bleeding or develops new symptoms like lightheadedness I advised that he present to the ED for evaluation. He was appreciative of the call and will come for evaluation if his catheter stops draining or his rectal bleeding worsens.  ?

## 2021-11-12 NOTE — Op Note (Addendum)
Operative Note ? ?Preoperative diagnosis:  ?1.  Prostate cancer ? ?Postoperative diagnosis: ?1.  Prostate cancer ? ?Procedure(s): ?1.  Primary cryotherapy of the prostate ? ?Surgeon: Link Snuffer, MD ? ?Assistants: None ? ?Anesthesia: General ? ?Complications: None immediate ? ?EBL: Minimal ? ?Specimens: ?1.  None ? ?Drains/Catheters: ?1.  Foley catheter ? ?Intraoperative findings: 1.  Normal anterior urethra 2.  Short nonobstructing prostate 3.  Normal bladder mucosa 4. 22.11m prostate ? ?Indication: 71year old male with prostate cancer elected to undergo prostate cryotherapy. ? ?Description of procedure: ? ?The patient was identified and consent was obtained.  The patient was brought to the operating room, and placed upon the operating table in the dorsal supine position, where general LMA anesthesia was introduced. He was then replaced in the dorsal lithotomy position, where the pubis was prepped and draped in usual fashion. Foley catheter was placed, and the scrotum was taped to the abdominal wall.  A timeout was performed. The patient had been given IV antibiotic. ? ?6 probes were placed in a standard fashion.  4 were placed posteriorly in the prostate and 2 were placed anteriorly.  These were placed under ultrasound guidance.  Temperature probes were placed in denonvier's fascia and another at the external sphincter.  After confirming with ultrasound that the probes were in proper position, the Foley catheter was removed.  Flexible cystoscopy was performed with the findings noted above.  Retroflexion confirmed that no probes had pierced into the bladder.  A Super Stiff wire was advanced into the bladder and the scope withdrawn.  A urethral warming device was advanced into the bladder and left in place for the entire procedure, and not removed for 15 minutes after the second thaw cycle was begun.  2 freeze then thaw cycles were performed.  After 15 minutes of warming, the urethral warming device was removed,  and a Foley catheter was placed to straight drainage. The patient tolerated the procedure well and was awakened and taken to recovery room in good condition.  ? ?Plan: Follow-up in 1 week for voiding trial  ?

## 2021-11-12 NOTE — Anesthesia Preprocedure Evaluation (Addendum)
Anesthesia Evaluation  ?Patient identified by MRN, date of birth, ID band ?Patient awake ? ? ? ?Reviewed: ?Allergy & Precautions, NPO status , Patient's Chart, lab work & pertinent test results ? ?Airway ?Mallampati: II ? ?TM Distance: >3 FB ?Neck ROM: Full ? ? ? Dental ? ?(+) Poor Dentition ?  ?Pulmonary ?neg pulmonary ROS,  ?  ?Pulmonary exam normal ? ? ? ? ? ? ? Cardiovascular ?hypertension, Pt. on medications ?+ CAD  ?Normal cardiovascular exam ? ?ECG: NSR, rate 76 ?  ?Neuro/Psych ?PSYCHIATRIC DISORDERS Anxiety Depression negative neurological ROS ?   ? GI/Hepatic ?negative GI ROS, Neg liver ROS,   ?Endo/Other  ?negative endocrine ROS ? Renal/GU ?negative Renal ROS  ? ?  ?Musculoskeletal ?negative musculoskeletal ROS ?(+)  ? Abdominal ?  ?Peds ? Hematology ? ?(+) Blood dyscrasia, anemia ,   ?Anesthesia Other Findings ?PROSTATE CANCER ? Reproductive/Obstetrics ? ?  ? ? ? ? ? ? ? ? ? ? ? ? ? ?  ?  ? ? ? ? ? ? ? ?Anesthesia Physical ?Anesthesia Plan ? ?ASA: 2 ? ?Anesthesia Plan: General  ? ?Post-op Pain Management:   ? ?Induction: Intravenous ? ?PONV Risk Score and Plan: 2 and Ondansetron, Dexamethasone, Midazolam and Treatment may vary due to age or medical condition ? ?Airway Management Planned: Oral ETT ? ?Additional Equipment:  ? ?Intra-op Plan:  ? ?Post-operative Plan: Extubation in OR ? ?Informed Consent: I have reviewed the patients History and Physical, chart, labs and discussed the procedure including the risks, benefits and alternatives for the proposed anesthesia with the patient or authorized representative who has indicated his/her understanding and acceptance.  ? ? ? ?Dental advisory given ? ?Plan Discussed with: CRNA ? ?Anesthesia Plan Comments:   ? ? ? ? ?Anesthesia Quick Evaluation ? ?

## 2021-11-12 NOTE — Anesthesia Procedure Notes (Signed)
Procedure Name: Intubation ?Date/Time: 11/12/2021 9:32 AM ?Performed by: [place] Cordai Rodrigue C, CRNA ?Pre-anesthesia Checklist: Patient identified, Emergency Drugs available, Suction available and Patient being monitored ?Patient Re-evaluated:Patient Re-evaluated prior to induction ?Oxygen Delivery Method: Circle system utilized ?Preoxygenation: Pre-oxygenation with 100% oxygen ?Induction Type: IV induction ?Ventilation: Mask ventilation without difficulty ?Laryngoscope Size: Sabra Heck and 2 ?Grade View: Grade I ?Tube type: Oral ?Tube size: 7.5 mm ?Number of attempts: 1 ?Airway Equipment and Method: Stylet ?Placement Confirmation: ETT inserted through vocal cords under direct vision, positive ETCO2 and breath sounds checked- equal and bilateral ?Secured at: 24 cm ?Tube secured with: Tape ?Dental Injury: Teeth and Oropharynx as per pre-operative assessment  ?Comments: Chipped front tooth prior to induction. Dentition remained the same as prior to intubation. ? ? ? ? ?

## 2021-11-14 ENCOUNTER — Encounter (HOSPITAL_COMMUNITY): Payer: Self-pay | Admitting: Urology

## 2021-11-23 ENCOUNTER — Encounter (HOSPITAL_COMMUNITY): Payer: Self-pay | Admitting: Urology
